# Patient Record
Sex: Female | Born: 1960 | Race: Black or African American | Hispanic: No | Smoking: Never smoker
Health system: Southern US, Community
[De-identification: ages and names within clinical notes are randomized; demographics above are authoritative.]

## PROBLEM LIST (undated history)

## (undated) DIAGNOSIS — T7840XA Allergy, unspecified, initial encounter: Secondary | ICD-10-CM

## (undated) HISTORY — DX: Allergy, unspecified, initial encounter: T78.40XA

---

## 1998-02-05 ENCOUNTER — Emergency Department (HOSPITAL_COMMUNITY): Admission: EM | Admit: 1998-02-05 | Discharge: 1998-02-05 | Payer: Self-pay | Admitting: Emergency Medicine

## 1999-10-05 ENCOUNTER — Emergency Department (HOSPITAL_COMMUNITY): Admission: EM | Admit: 1999-10-05 | Discharge: 1999-10-05 | Payer: Self-pay | Admitting: Emergency Medicine

## 1999-10-06 ENCOUNTER — Encounter: Payer: Self-pay | Admitting: Emergency Medicine

## 2000-11-08 ENCOUNTER — Encounter: Payer: Self-pay | Admitting: Obstetrics and Gynecology

## 2000-11-08 ENCOUNTER — Ambulatory Visit (HOSPITAL_COMMUNITY): Admission: RE | Admit: 2000-11-08 | Discharge: 2000-11-08 | Payer: Self-pay | Admitting: Obstetrics and Gynecology

## 2001-09-11 ENCOUNTER — Ambulatory Visit (HOSPITAL_COMMUNITY): Admission: RE | Admit: 2001-09-11 | Discharge: 2001-09-11 | Payer: Self-pay | Admitting: Internal Medicine

## 2006-07-03 HISTORY — PX: LUNG SURGERY: SHX703

## 2006-10-13 ENCOUNTER — Inpatient Hospital Stay (HOSPITAL_COMMUNITY): Admission: EM | Admit: 2006-10-13 | Discharge: 2006-10-23 | Payer: Self-pay | Admitting: Emergency Medicine

## 2006-10-14 ENCOUNTER — Ambulatory Visit: Payer: Self-pay | Admitting: Thoracic Surgery

## 2006-10-18 ENCOUNTER — Encounter (INDEPENDENT_AMBULATORY_CARE_PROVIDER_SITE_OTHER): Payer: Self-pay | Admitting: Specialist

## 2006-10-31 ENCOUNTER — Encounter: Admission: RE | Admit: 2006-10-31 | Discharge: 2006-10-31 | Payer: Self-pay | Admitting: Thoracic Surgery

## 2006-10-31 ENCOUNTER — Ambulatory Visit: Payer: Self-pay | Admitting: Thoracic Surgery

## 2006-11-28 ENCOUNTER — Encounter: Admission: RE | Admit: 2006-11-28 | Discharge: 2006-11-28 | Payer: Self-pay | Admitting: Thoracic Surgery

## 2006-11-28 ENCOUNTER — Ambulatory Visit: Payer: Self-pay | Admitting: Thoracic Surgery

## 2007-02-06 ENCOUNTER — Encounter: Admission: RE | Admit: 2007-02-06 | Discharge: 2007-02-06 | Payer: Self-pay | Admitting: Thoracic Surgery

## 2007-02-06 ENCOUNTER — Ambulatory Visit: Payer: Self-pay | Admitting: Thoracic Surgery

## 2008-04-28 IMAGING — CT CT CHEST W/ CM
3 series · 17 of 29 positions shown, 19 images · IV contrast (75ML OMNI 300)
Comparison: We have a report from an abdomen CT dated 10/06/99.

CLINICAL DATA: Spontaneous pneumothorax on the left. 
 CHEST CT WITH CONTRAST:
TECHNIQUE: Multidetector CT imaging of the chest was performed following the standard protocol during bolus administration of intravenous contrast.
 Contrast:  80 cc Omnipaque 300.

[Series 2: routine chest · axial · 0.77mm/px · z∈[-240,-60]mm · 6 of 56 slices shown, 8 images]
[im 10/56  mediastinal]
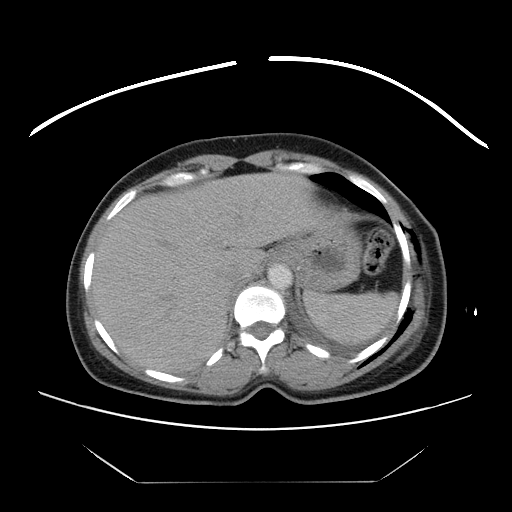
[im 10/56  lung]
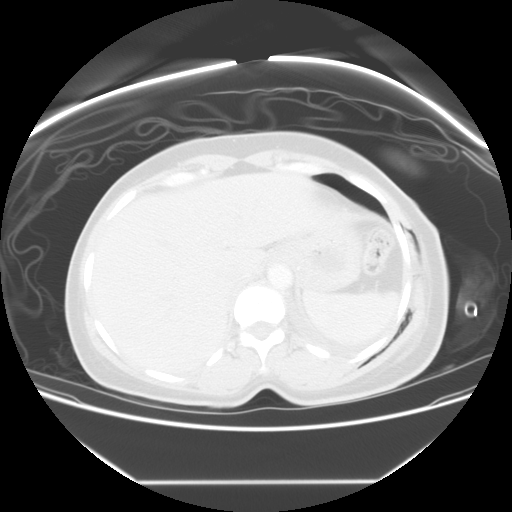
[im 19/56  lung]
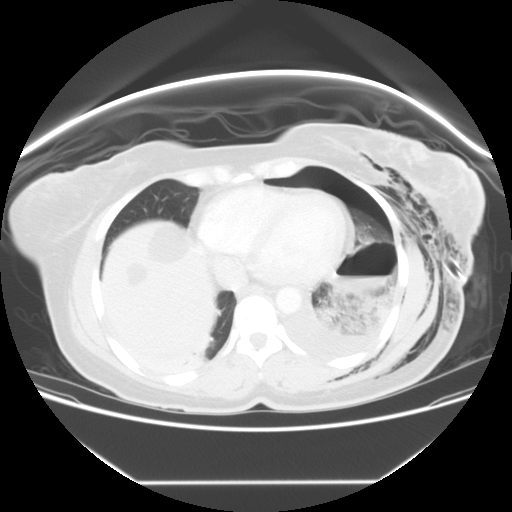
[im 28/56  lung]
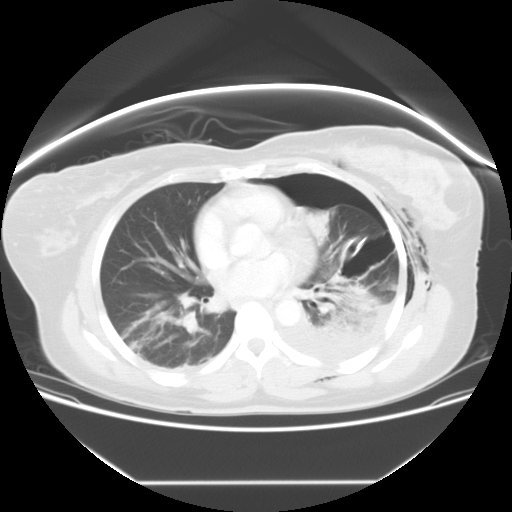
[im 32/56  lung]
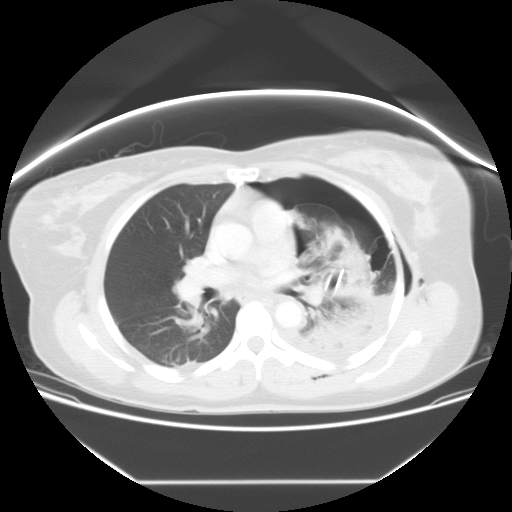
[im 37/56  mediastinal]
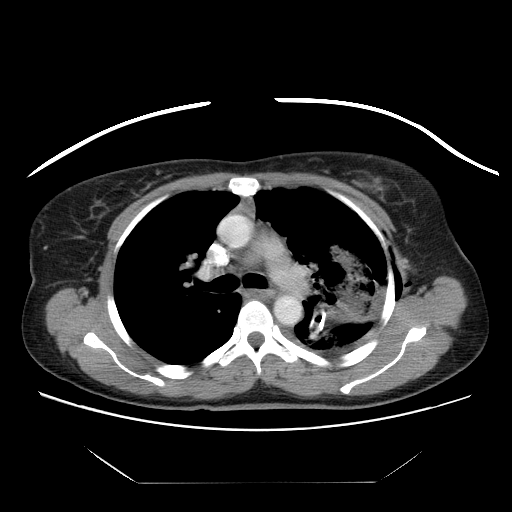
[im 37/56  lung]
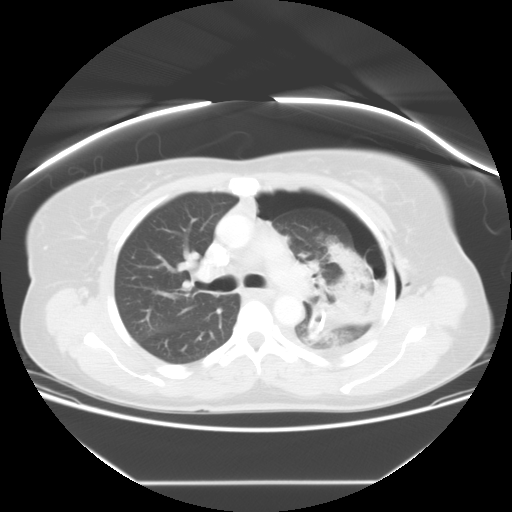
[im 46/56  lung]
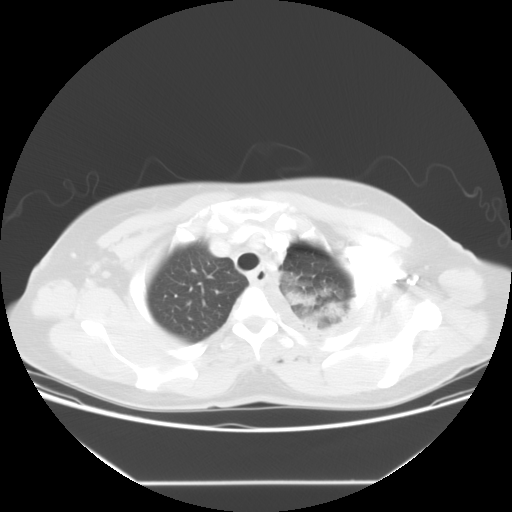

[Series 400: sag chest · sagittal · 0.62mm/px · 8 of 106 slices shown]
[im 9/106  lung]
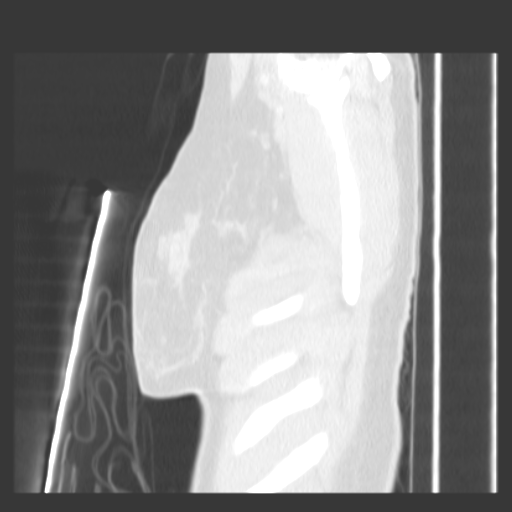
[im 27/106  lung]
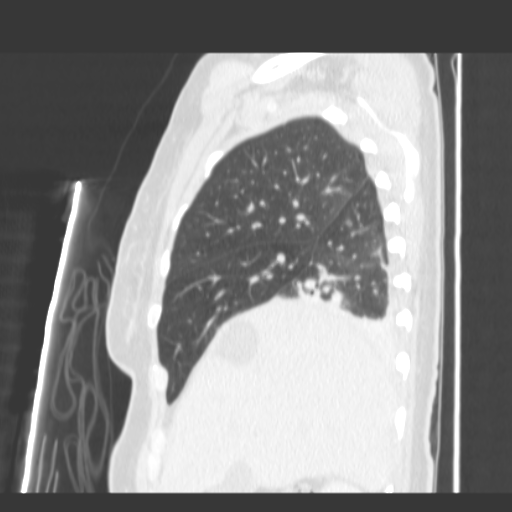
[im 36/106  lung]
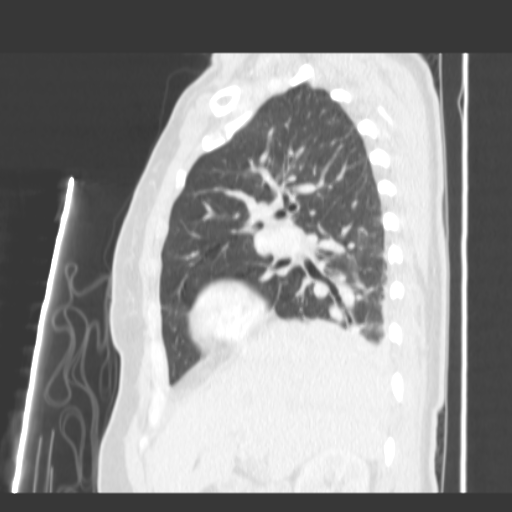
[im 44/106  lung]
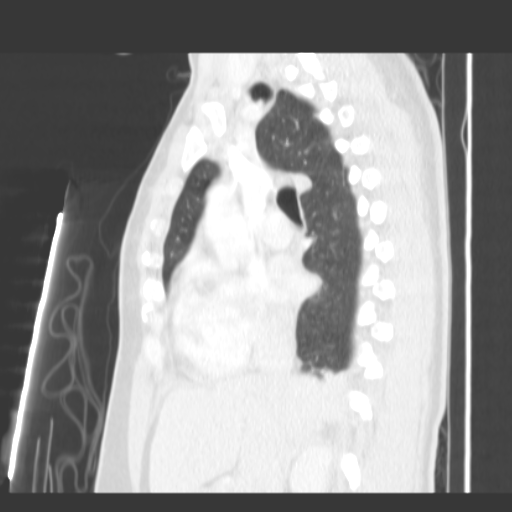
[im 62/106  lung]
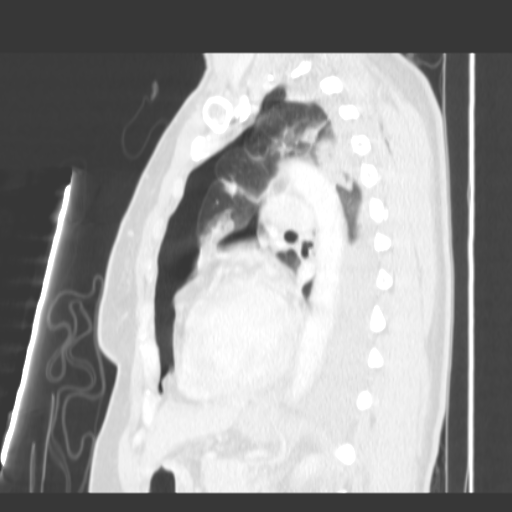
[im 71/106  lung]
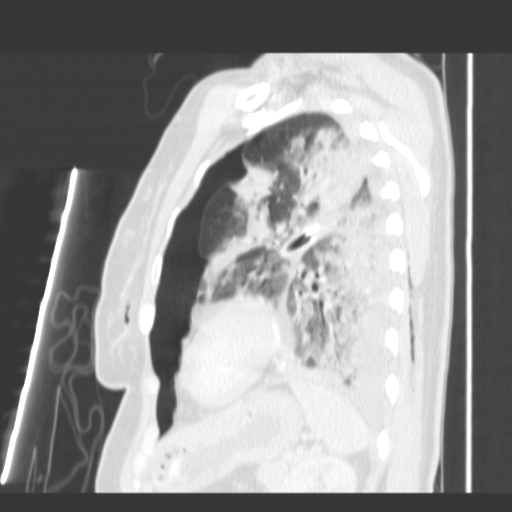
[im 79/106  lung]
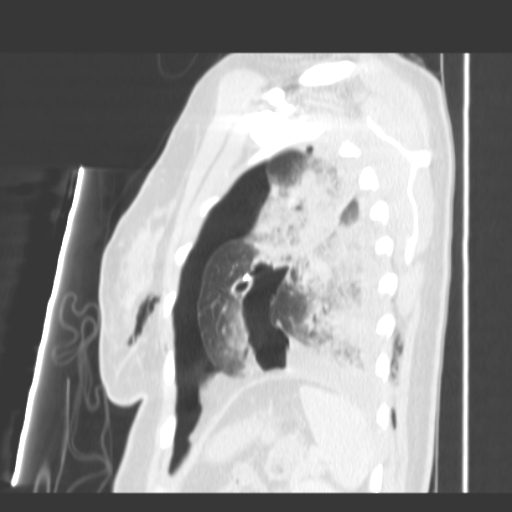
[im 97/106  lung]
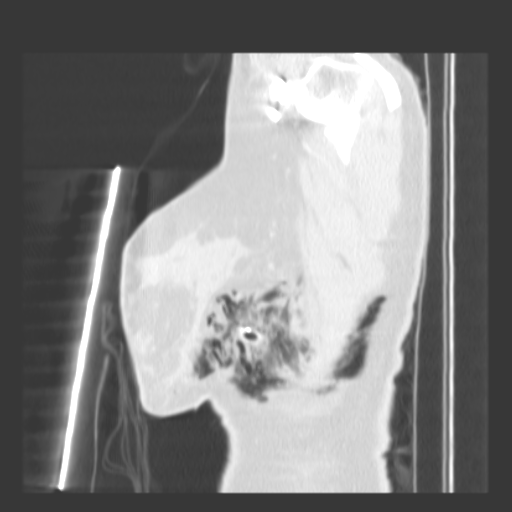

[Series 401: cor chest · coronal · 0.70mm/px · 3 of 68 slices shown]
[im 10/68  lung]
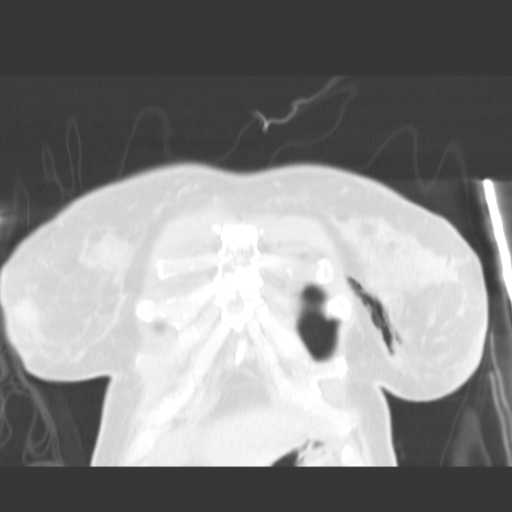
[im 20/68  lung]
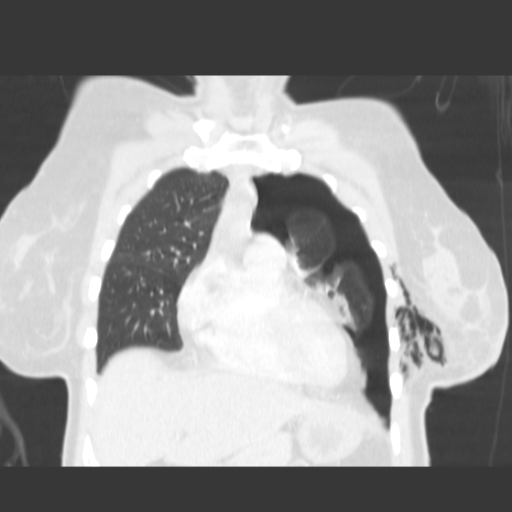
[im 29/68  lung]
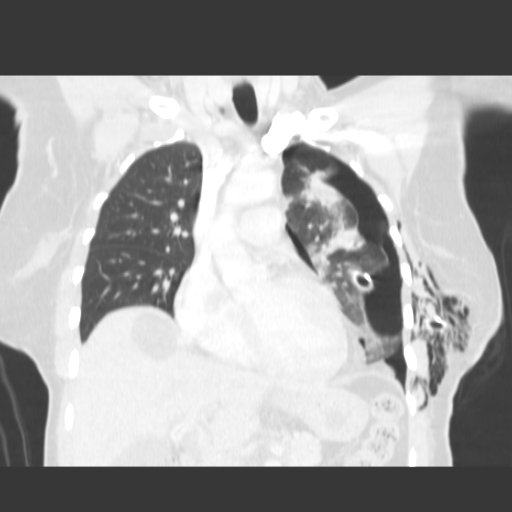

[17 of 29 positions shown; findings below may reference images not displayed]

Films are not available. The report describes a 7 cm bulla at the left lung base and liver cysts.
FINDINGS: A 15% left pneumothorax is present. A left chest tube passes into the left hemithorax and is positioned in the left greater fissure. It does pass adjacent what I believe is a large bleb in the left lower lobe containing an air-fluid level. Airspace disease is present in the left upper and left lower lobes diffusely.  Linear atelectasis is present at the right lung base. Negative abnormal mediastinal adenopathy. Negative pericardial effusion.  No pneumomediastinum. No right pneumothorax.  Extensive subcutaneous emphysema is seen over the left hemithorax. No acute fractures. 
 In the upper abdomen, incidental note is made of innumerable liver hypodensities.  There largest ones are characterized as cysts.
IMPRESSION: 1.  15% left pneumothorax. 
 2.  Left chest tube is in the greater fissure adjacent to a large left lower lobe bulla containing an air-fluid level.
 3.  Liver cysts.

## 2008-04-28 IMAGING — CR DG CHEST 1V PORT
1 series · 1 of 1 positions shown · non-contrast
Comparison: 10/15/06.

CLINICAL DATA: Pneumothorax. 
 PORTABLE CHEST ([DATE] HOURS):

[view not recorded]
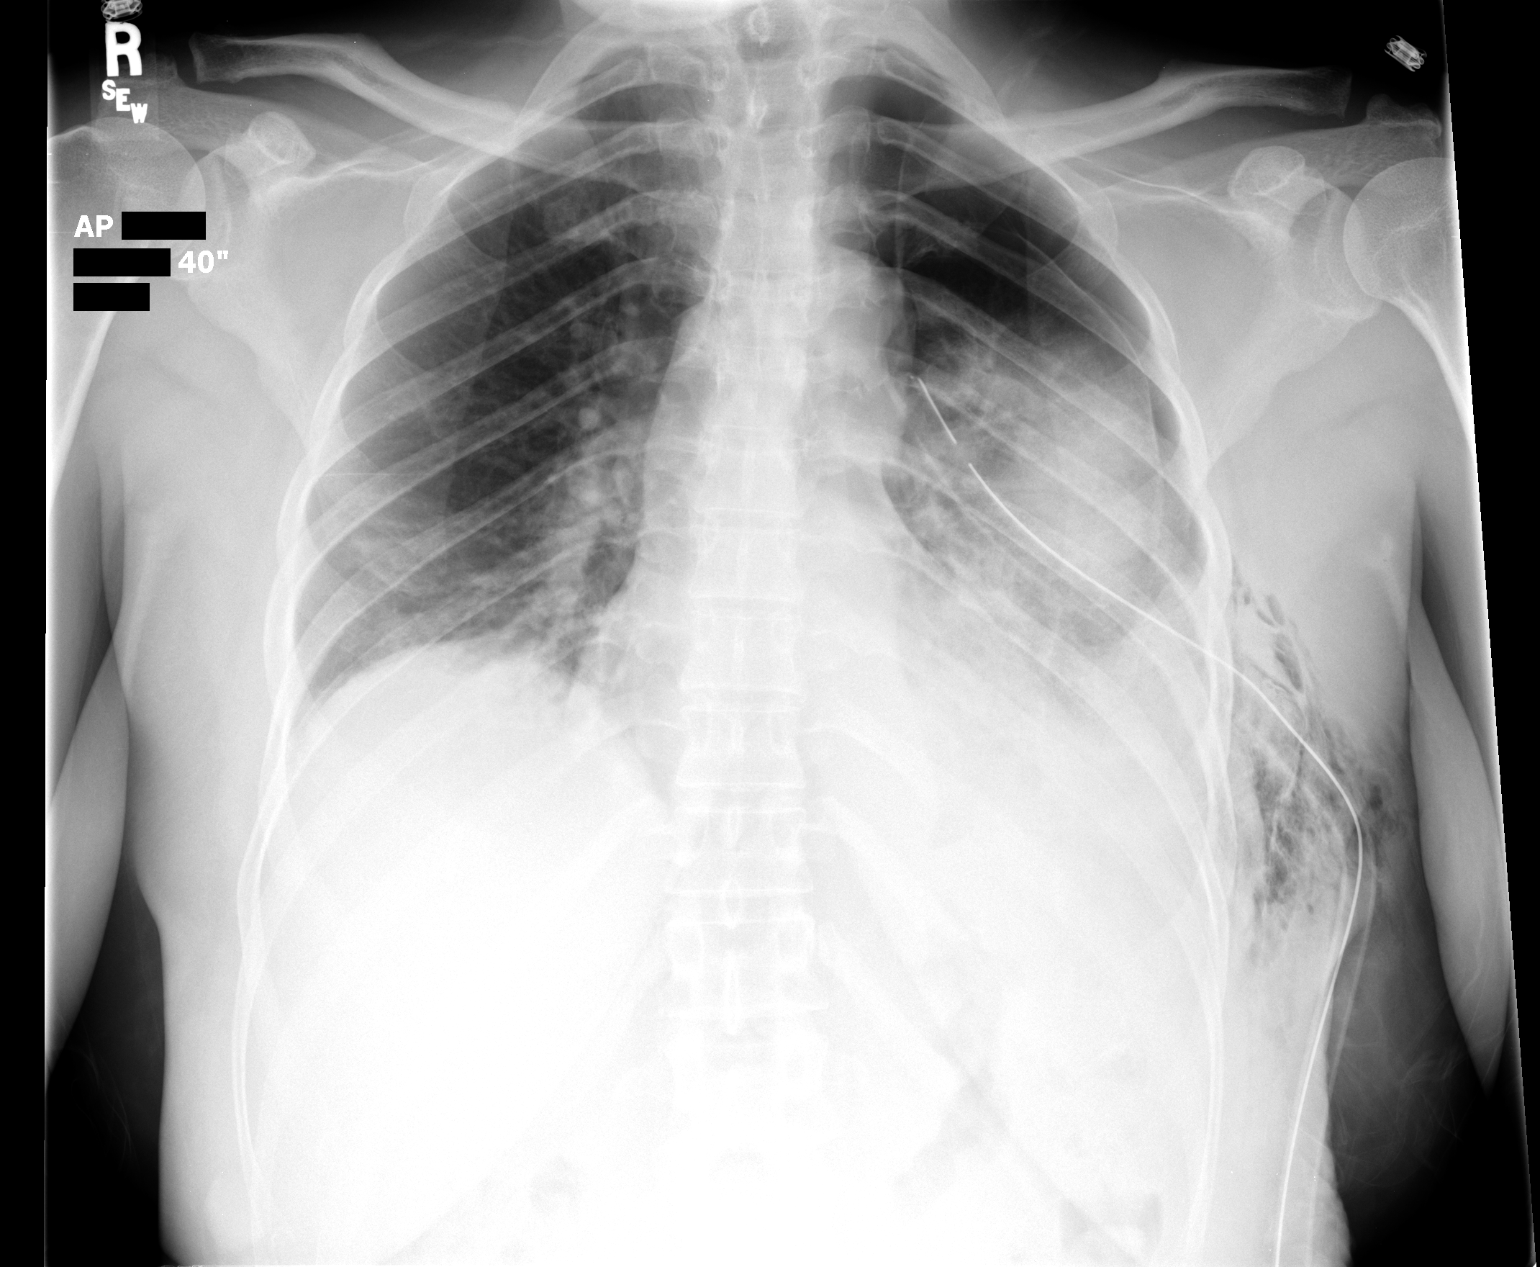

[1 of 1 positions shown; findings below may reference images not displayed]

FINDINGS: The left pneumothorax has increased and is now approximately 15%.  The chest tube remains in place.  Subcutaneous emphysema has increased.  Consolidation throughout the left lung is unchanged.  Bibasilar atelectasis is stable.  The heart remains mildly prominent.
IMPRESSION: Left pneumothorax has increased to 15%.

## 2010-11-15 NOTE — Assessment & Plan Note (Signed)
OFFICE VISIT   [name], [name]  DOB:  08/08/1960                                        February 06, 2007  CHART #:  [id number]   She came for followup today.  Her blood pressure is 142/92.  Pulse 100.  Respirations 18.  Sats were 98%.   Her chest x-ray showed no evidence for recurrence.   She is now four months since her trauma and is back to full activity.   We will see her back again if she has any future problems.   Ines Bloomer, M.D.  Electronically Signed   DPB/MEDQ  D:  02/06/2007  T:  02/06/2007  Job:  [id number]

## 2010-11-15 NOTE — Assessment & Plan Note (Signed)
OFFICE VISIT   [name], ROAT  DOB:  1961/05/16                                        Nov 28, 2006  CHART #:  [id number]   Ms. [name] came today.  She is stable after a left Vats for persistent  pneumothorax.  Her incisions are well-healed.  She has had minimal pain.  I released her to full activity, to return to work.  Her blood pressure  was 138/82, pulse 80, respirations 18, saturations were 100%.   Ines Bloomer, M.D.  Electronically Signed   DPB/MEDQ  D:  11/28/2006  T:  11/28/2006  Job:  [id number]

## 2010-11-18 NOTE — H&P (Signed)
[name], [name]              ACCOUNT NO.:  [id number]   MEDICAL RECORD NO.:  [id number]          PATIENT TYPE:  EMS   LOCATION:  MAJO                         FACILITY:  [facility]   PHYSICIAN:  Ines Bloomer, M.D. DATE OF BIRTH:  [birth year]   DATE OF ADMISSION:  10/13/2006  DATE OF DISCHARGE:                              HISTORY & PHYSICAL   HISTORY OF PRESENT ILLNESS:  This 46-year African American female had  some shortness of breath that she contributed to allergies.  Because of  increasing shortness, she went to the [facility] and a chest x-  ray revealed a left pneumothorax with a total collapse of the left lung  with some mild tension.  She is brought to the emergency room and  underwent insertion of the left chest tube without difficulty.  She is a  nonsmoker, has had seasonal allergies in the past.  No hemoptysis,  fever, chills, excessive sputum.  No evidence of pneumonia.   PAST MEDICAL HISTORY:  She has delivered two children.  Otherwise no  other hospitalization.   She is on no meds.    No allergies.   FAMILY HISTORY:  Noncontributory.   SOCIAL HISTORY:  She is a nonsmoker, married with two children, works in  a [employer].   REVIEW OF SYSTEMS:  GENERAL:  Her weight has been stable.  She has been  in good health.  CARDIAC:  No angina or atrial fibrillation.  PULMONARY:  See history of present illness.  GI:  No nausea, vomiting, constipation,  or diarrhea.  GU:  No dysuria or frequent urination.  VASCULAR:  No  claudication, DVT, TIAs.  NEUROLOGIC: No headaches, blackouts, or  seizures.  MUSCULOSKELETAL:  No joint pains, some mild arthritis.  HEMATOLOGIC:  No problems with bleeding or clotting disorders.   PHYSICAL EXAMINATION:  VITAL SIGNS:  Her blood pressure is 120/80, pulse  100, respirations 18, sats were 90%.  HEENT:  Head is atraumatic.  Eyes:  Pupils equal, reactive to light and  accommodation.  Extraocular movements normal.  Ears: Tympanic  membranes  are intact.  Nose: There is no septal deviation.  Throat without  lesions.  NECK:  Supple without thyromegaly.  There is no supraclavicular axillary  adenopathy.  CHEST:  Absent breath sounds on the left.  Normal breath sounds on the  right.  HEART:  Regular sinus rhythm, no murmurs.  ABDOMEN:  Soft.  There is no hepatosplenomegaly.  EXTREMITIES:  Pulse 2+.  There is no clubbing or edema.  NEUROLOGICAL:  She is oriented x3.  Sensory and motor intact.  Cranial  nerves intact.  SKIN:  Without lesion.   IMPRESSION:  Left spontaneous pneumothorax with tension.   PLAN:  Insertion of left chest tube.      Ines Bloomer, M.D.  Electronically Signed     DPB/MEDQ  D:  10/13/2006  T:  10/13/2006  Job:  [id number]

## 2010-11-18 NOTE — Discharge Summary (Signed)
[name], [name]              ACCOUNT NO.:  [id number]   MEDICAL RECORD NO.:  [id number]          PATIENT TYPE:  INP   LOCATION:  3301                         FACILITY:  [facility]   PHYSICIAN:  Ines Bloomer, M.D. DATE OF BIRTH:  23-Feb-1961   DATE OF ADMISSION:  10/13/2006  DATE OF DISCHARGE:  10/23/2006                               DISCHARGE SUMMARY   HISTORY OF PRESENT ILLNESS:  The patient is a 50 year old black female  who was recently evaluated at [facility] due to increasing  symptoms with shortness of breath that she attributed to allergies.  An  x-ray was obtained during this evaluation and this revealed a total  collapse of her left lung with some evidence of tension pneumothorax.  She was subsequently brought to the emergency room at [facility], where  she underwent insertion of a left chest tube without difficulty.  She is  a nonsmoker.  She has a history of seasonal allergies in the past.  She  denies hemoptysis, fevers, chills or excessive sputum.  There was no  evidence on chest x-ray of pneumonia.  She was felt to require admission  for further evaluation and treatment and was admitted to the thoracic  surgical service under the care of Dr. Edwyna Shell.   PAST MEDICAL HISTORY:  Is unremarkable.  She has delivered 2 children.   MEDICATIONS PRIOR TO ADMISSION:  None.   ALLERGIES TO MEDICATIONS:  NONE.   FAMILY HISTORY:  Noncontributory.   SOCIAL HISTORY:  She is a nonsmoker.  She works in a [employer].  She is  married and has 2 children.   REVIEW OF SYSTEMS:  Please see the history and physical done at the time  of admission.   PHYSICAL EXAMINATION:  Please see the history and physical done at the  time of admission.   HOSPITAL COURSE:  Patient was admitted.  Dr. Edwyna Shell evaluated the  patient and the patient was kept on chest tube suction for a couple of  days.  Daily chest x-rays were obtained to monitor her progress.  She  unfortunately continued to  have some evidence of pneumothorax and  subsequent to this a chest CT scan was obtained.  This revealed a 30%  pneumothorax with large bolus disease.  She did not progress in the  usual manner and, therefore, it was Dr. Scheryl Darter opinion that as the  chest tube alone was not functioning, she would require a video-assisted  thoracoscopy for resection of a large left upper lobe bola, as well as  mechanical pleurodesis for a more definitive treatment of her condition.   PROCEDURE:  On October 19, 2006, she was taken to the operating room, at  which time she underwent the following procedure:  Left video-assisted  thoracoscopy with resection of large left upper lobe bola and mechanical  pleurodesis.  She tolerated this procedure well and was taken to the  postanesthesia care unit in stable condition.   POSTOPERATIVE HOSPITAL COURSE:  Patient has remained stable.  She did  not have an air leak in her chest tube system.  Her chest tubes were  discontinued in a routine manner.  Her incision is healing without  evidence of infection.  She is tolerating a routine advancement in  activity, commenced for the level of postoperative convalescence using  standard protocols.  Her overall status is felt to be tentatively stable  for discharge in the morning of October 23, 2006 pending morning round  reevaluation.   CONDITION ON DISCHARGE:  Stable and improving.   FINAL DIAGNOSIS:  Large left-sided long boli, now status post resection.  Admission diagnosis of spontaneous pneumothorax (tension).   OTHER DIAGNOSES:  1. Previous history of childbirth x2.  2. History of environmental allergies, seasonal.   INSTRUCTIONS:  The patient received written instructions in regard to  medications, activity, diet, wound care and followup.   FOLLOWUP:  Include Dr. Edwyna Shell in 1 week, an office appointment will be  arranged for the patient with a chest x-ray.   MEDICATIONS ON DISCHARGE:  For pain, Tylox 1 or 2 q.6  hours as needed.      Rowe Clack, P.A.-C.      Ines Bloomer, M.D.  Electronically Signed    WEG/MEDQ  D:  10/22/2006  T:  10/22/2006  Job:  [id number]

## 2010-11-18 NOTE — Op Note (Signed)
NAMEKEYUNNA, COCO              ACCOUNT NO.:  [id number]   MEDICAL RECORD NO.:  [id number]          PATIENT TYPE:  INP   LOCATION:  5705                         FACILITY:  [facility]   PHYSICIAN:  Ines Bloomer, M.D. DATE OF BIRTH:  [birth year]   DATE OF PROCEDURE:  10/19/2006  DATE OF DISCHARGE:                               OPERATIVE REPORT   PREOPERATIVE DIAGNOSIS:  Persistent left pneumothorax and left apical  bulla.   POSTOPERATIVE DIAGNOSIS:  Giant bulla left apex.   OPERATION PERFORMED:  Left video assisted thoracoscopic surgery,  resection of giant apical bulla, pleurectomy, and pleurodesis.   SURGEON:  Ines Bloomer, M.D.   FIRST ASSISTANT:  Rowe Clack, P.A.-C.   After percutaneous insertion of all monitoring lines, the patient  underwent general anesthesia, and was prepped and draped in the usual  sterile manner.  A double lumen tube was inserted, the left lung was  deflated. The previous chest tube was removed.  It was used as one  trocar site, it was in the anterior axillary line at the sixth  intercostal space and then at the posterior axillary line at the seventh  intercostal space, three trocars were inserted and you could seed a  giant bulla. Pictures were taken of this.  A 3 cm incision was made in  the third intercostal space at the anterior axillary line and the third  intercostal space was entered.  Through that was inserted a double  trocar and the bulla had marked adhesions to the chest wall.  So, the  first thing we did was take down the adhesions with electrocautery and  using the autosuture 30 blue stapler to remove it.  After this had been  done, the bulla was reflected superiorly down to the small base and then  coming across with an autosuture 60 stapler, this was resected and the  bulla was resected. After the bulla was dissected, we then did a  pleurectomy using Kaiser ring forceps down to the fifth rib and then  using a Bovie scraper, did a  mechanical pleurodesis removed.  Three  chest tubes were placed through the trocar sites and tied in place with  0 zero silk.  The superior trocar site was closed with 2-0 Vicryl and 3-  0 Vicryl as a subcuticular stitch.  The patient was returned to the  recovery room in stable condition.      Ines Bloomer, M.D.  Electronically Signed     DPB/MEDQ  D:  10/19/2006  T:  10/20/2006  Job:  [id number]

## 2010-11-18 NOTE — Op Note (Signed)
[facility]  Patient:    [name], [name] Visit Number: [id number] MRN: [id number]          Service Type: DSU Location: [facility] Attending Physician:  Malon Kindle Dictated by:   Malachi Pro. Ambrose Mantle, M.D. Proc. Date: 09/11/01 Admit Date:  09/11/2001                             Operative Report  PREOPERATIVE DIAGNOSES:       Leiomyomata uteri, voluntary sterilization.  POSTOPERATIVE DIAGNOSES:      Leiomyomata uteri, voluntary sterilization.  OPERATION:                    Laparoscopic tubal cauterization.  OPERATOR:                     Malachi Pro. Ambrose Mantle, M.D.  ANESTHESIA:                   General.  PROCEDURE:                    The patient was brought to the operating room and placed under satisfactory general anesthesia.  She was placed in Chester stirrups.  The abdomen, vulva, vagina, and urethra were prepped with Betadine solution.  The bladder was emptied with a [place] catheter.  Examination revealed the uterus to be anterior, approximately 10 weeks size, irregular. No adnexal masses were felt.  A Hulka cannula was placed into the uterus and attached to the anterior cervical lip.  The abdomen was then draped as a sterile field.  A small incision was made in the inferior portion of the umbilicus.  A Verres cannula was placed into the abdominal cavity.  Aspiration revealed it was not in a blood vessel, not in bowel contents or urine.  Saline was pushed into the peritoneal cavity and none could be recovered.  There seemed to be a negative pressure.  I then inflated about 2 L of CO2 into the peritoneal cavity with pressures of 7-12 mmHg.  Then a large incision inserted the laparoscopic trocar followed by the laparoscope with good visualization of the pelvic contents.  A smaller incision was made suprapubically and a smaller trocar was inserted under direct vision.  The uterus was manipulated into the operative field, although it was  somewhat difficult to manipulate it because it was so big and the cannula did not really move it as well as you might want it to.  I was able to inspect the posterior cul-de-sac.  There was a small amount of endometriosis lateral to the left uterosacral ligament.  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  The anterior cul-de-sac was normal.  The right tube and ovary were normal as were the left tube and ovary. Exploration of the upper abdomen revealed the liver to be smooth.  I did not see the gallbladder.  I did not see any upper abdominal abnormalities.  I then cauterized each tube in three or four consecutive locations starting in the mid portion and going proximally toward the uterine cornu.  I continued the cauterization until the meter read 0.  Reinspection of the surgical site revealed no bleeding.  I inspected the lower abdomen trocar site for any bleeding.  There was none.  I then inspected the umbilical trocar  site for any bleeding as I withdrew the laparoscope after releasing the pneumoperitoneum and there was no bleeding.  I closed the umbilical incision with 3-0 plain catgut interrupted sutures and the lower incision with Steri-Strips.  I removed the Hulka cannula and the procedure was terminated.  The patient was returned to recovery in satisfactory condition.  I did make pictures of the fibroids and both tubes and ovaries. Dictated by:   Malachi Pro. Ambrose Mantle, M.D. Attending Physician:  Malon Kindle DD:  09/11/01 TD:  09/12/01 Job: [id number]

## 2014-03-05 ENCOUNTER — Emergency Department (HOSPITAL_COMMUNITY)
Admission: EM | Admit: 2014-03-05 | Discharge: 2014-03-05 | Disposition: A | Discharge status: Home / Self Care | Payer: Worker's Compensation | Attending: Emergency Medicine | Admitting: Emergency Medicine

## 2014-03-05 ENCOUNTER — Encounter (HOSPITAL_COMMUNITY): Payer: Self-pay | Admitting: Emergency Medicine

## 2014-03-05 DIAGNOSIS — IMO0001 Reserved for inherently not codable concepts without codable children: Secondary | ICD-10-CM | POA: Diagnosis not present

## 2014-03-05 DIAGNOSIS — M25539 Pain in unspecified wrist: Secondary | ICD-10-CM | POA: Insufficient documentation

## 2014-03-05 DIAGNOSIS — M79609 Pain in unspecified limb: Secondary | ICD-10-CM | POA: Insufficient documentation

## 2014-03-05 DIAGNOSIS — M79601 Pain in right arm: Secondary | ICD-10-CM

## 2014-03-05 MED ORDER — MELOXICAM 7.5 MG PO TABS: 7.5000 mg | ORAL_TABLET | Freq: Every day | ORAL | Status: DC

## 2014-03-05 NOTE — ED Notes (Signed)
Pt c/o right wrist pain that radiates up to her elbow x 3-4 months. Works packing bread in boxes.

## 2014-03-05 NOTE — Discharge Instructions (Signed)
Please read and follow all provided instructions.  Your diagnoses today include:  1. Upper extremity pain, diffuse, right     Tests performed today include:  Vital signs. See below for your results today.   Medications prescribed:   Meloxicam - anti-inflammatory pain medication  You have been prescribed an anti-inflammatory medication or NSAID. Take with food. Do not take aspirin, ibuprofen, or naproxen if taking this medication. Take smallest effective dose for the shortest duration needed for your pain. Stop taking if you experience stomach pain or vomiting.    Take any prescribed medications only as directed.  Home care instructions:   Follow any educational materials contained in this packet  Follow R.I.C.E. Protocol:  R - rest your injury   I  - use ice on injury without applying directly to skin  C - compress injury with bandage or splint  E - elevate the injury as much as possible  Follow-up instructions: Please follow-up with your primary care provider or the provided orthopedic physician (bone specialist) if you continue to have significant pain in 1 week.   Return instructions:   Please return if your fingers are persistently numb or tingling, appear gray or blue, or you have severe pain (also elevate leg and loosen splint or wrap if you were given one)  Please return to the Emergency Department if you experience worsening symptoms.   Please return if you have any other emergent concerns.  Additional Information:  Your vital signs today were: BP 144/92   Pulse 79   Temp(Src) 98.7 F (37.1 C) (Oral)   Resp 18   SpO2 99% If your blood pressure (BP) was elevated above 135/85 this visit, please have this repeated by your doctor within one month. --------------

## 2014-03-05 NOTE — ED Provider Notes (Signed)
CSN: [id number]     Arrival date & time 03/05/14  1042 History  This chart was scribed for non-physician practitioner, Rhea Bleacher, PA-C working with Flint Melter, MD by Greggory Stallion, ED scribe. This patient was seen in room TR10C/TR10C and the patient's care was started at 11:20 AM.   Chief Complaint  Patient presents with  . Wrist Pain   The history is provided by the patient. No language interpreter was used.   HPI Comments: [name] is a 53 y.o. female who presents to the Emergency Department complaining of right wrist pain that radiates into her elbow that worsened about 5-6 months ago. States she has had similar pain for the last 2 years. Reports intermittent tingling in her fingers and elbow. Pt does repetitive movements of her wrists at work. She has used a wrist brace and taken aleve with little relief. Certain movements worsen the pain. Denies numbness. Denies history of carpal tunnel.   History reviewed. No pertinent past medical history. History reviewed. No pertinent past surgical history. History reviewed. No pertinent family history. History  Substance Use Topics  . Smoking status: Never Smoker   . Smokeless tobacco: Not on file  . Alcohol Use: No   OB History   Grav Para Term Preterm Abortions TAB SAB Ect Mult Living                 Review of Systems  Constitutional: Negative for fever.  HENT: Negative for congestion.   Eyes: Negative for redness.  Respiratory: Negative for shortness of breath.   Cardiovascular: Negative for chest pain.  Gastrointestinal: Negative for abdominal distention.  Musculoskeletal: Positive for arthralgias and myalgias. Negative for neck pain.  Skin: Negative for rash.  Neurological: Negative for numbness.  Psychiatric/Behavioral: Negative for confusion.   Allergies  Codeine  Home Medications   Prior to Admission medications   Not on File   BP 144/92  Pulse 79  Temp(Src) 98.7 F (37.1 C) (Oral)  Resp 18  SpO2  99%  Physical Exam  Nursing note and vitals reviewed. Constitutional: She is oriented to person, place, and time. She appears well-developed and well-nourished. No distress.  HENT:  Head: Normocephalic and atraumatic.  Eyes: Conjunctivae and EOM are normal. Pupils are equal, round, and reactive to light.  Neck: Normal range of motion. Neck supple. No tracheal deviation present.  Cardiovascular: Normal rate.  Exam reveals no decreased pulses.   Pulmonary/Chest: Effort normal. No respiratory distress.  Musculoskeletal: Normal range of motion. She exhibits tenderness. She exhibits no edema.       Right shoulder: Normal.       Right elbow: Normal.      Right wrist: She exhibits tenderness. She exhibits normal range of motion, no bony tenderness and no swelling.       Right upper arm: Normal.       Right forearm: She exhibits no tenderness, no bony tenderness and no swelling.       Right hand: Normal. She exhibits normal range of motion, no tenderness and no swelling. Normal sensation noted. Normal strength noted.  Pos tinel, Neg Phalen  Neurological: She is alert and oriented to person, place, and time. No sensory deficit.  Motor, sensation, and vascular distal to the injury is fully intact.   Skin: Skin is warm and dry.  Psychiatric: She has a normal mood and affect. Her behavior is normal.    ED Course  Procedures (including critical care time)  DIAGNOSTIC STUDIES: Oxygen Saturation is  99% on RA, normal by my interpretation.    COORDINATION OF CARE: 11:26 AM-Discussed treatment plan which includes a stronger anti-inflammatory with pt at bedside and pt agreed to plan. Will give pt an orthopedic referral and advised her to follow up.   Labs Review Labs Reviewed - No data to display  Imaging Review No results found.   EKG Interpretation None        Vital signs reviewed and are as follows: BP 144/92  Pulse 79  Temp(Src) 98.7 F (37.1 C) (Oral)  Resp 18  SpO2  99%  Ortho referral given. Patient to continue stretching exercises. Counseled on use of prescription NSAID.  MDM   Final diagnoses:  Upper extremity pain, diffuse, right   Patient with upper extremity pain likely due to overuse injury, possible carpal or cubital tunnel syndrome. She will nee to follow up with orthopedist for definitive diagnosis and treatment. At this time, will give prescription NSAID. Patient counseled on rice protocol. No acute neurovascular compromise suspected.  I personally performed the services described in this documentation, which was scribed in my presence. The recorded information has been reviewed and is accurate.d  Renne Crigler, PA-C 03/05/14 1146

## 2014-03-05 NOTE — ED Notes (Signed)
[name], phlebotomy, at bedside to get work comp information and urine sample from patient.

## 2014-03-05 NOTE — ED Notes (Signed)
Registration at bedside with patient going over work comp information.

## 2014-03-05 NOTE — ED Provider Notes (Signed)
Medical screening examination/treatment/procedure(s) were performed by non-physician practitioner and as supervising physician I was immediately available for consultation/collaboration.   EKG Interpretation None       Trevious Rampey L Anahit Klumb, MD 03/05/14 2010 

## 2014-03-05 NOTE — ED Notes (Signed)
Went to discharge patient and she advised that she has to have a drug test for workers comp.   Contacted Rulon Eisenmenger to advise need someone to come get from patient.  Also spoke with Horner.

## 2014-03-05 NOTE — ED Notes (Signed)
Pt c/o right wrist pain x 2 years worse over several months into elbow; pt sts her job involves using her hands

## 2016-02-05 ENCOUNTER — Ambulatory Visit (INDEPENDENT_AMBULATORY_CARE_PROVIDER_SITE_OTHER): Payer: BLUE CROSS/BLUE SHIELD | Admitting: Osteopathic Medicine

## 2016-02-05 VITALS — BP 124/82 | HR 98 | Temp 98.9°F | Resp 14 | Ht 65.0 in | Wt 193.0 lb

## 2016-02-05 DIAGNOSIS — M545 Low back pain, unspecified: Secondary | ICD-10-CM | POA: Insufficient documentation

## 2016-02-05 DIAGNOSIS — R350 Frequency of micturition: Secondary | ICD-10-CM | POA: Diagnosis not present

## 2016-02-05 LAB — POC MICROSCOPIC URINALYSIS (UMFC): MUCUS RE: ABSENT

## 2016-02-05 LAB — POCT URINALYSIS DIP (MANUAL ENTRY)
BILIRUBIN UA: NEGATIVE
BILIRUBIN UA: NEGATIVE
Glucose, UA: NEGATIVE
NITRITE UA: NEGATIVE
PH UA: 6
PROTEIN UA: NEGATIVE
Spec Grav, UA: 1.025
Urobilinogen, UA: 0.2

## 2016-02-05 MED ORDER — KETOROLAC TROMETHAMINE 60 MG/2ML IM SOLN
60.0000 mg | Freq: Once | INTRAMUSCULAR | Status: AC
  Administered 2016-02-05: 60 mg via INTRAMUSCULAR

## 2016-02-05 MED ORDER — MELOXICAM 7.5 MG PO TABS: 7.5000 mg | ORAL_TABLET | Freq: Every day | ORAL | 0 refills | Status: DC | PRN

## 2016-02-05 NOTE — Progress Notes (Signed)
HPI: [name] is a 55 y.o.  female  who presents to [facility] today, 02/05/16,  for chief complaint of:  Chief Complaint  Patient presents with  . Urinary Tract Infection    C/O urinary frequency, pressure in lower back, & pains radiating down legs x 1 mth     . Context: previous UTI >1 year ago, similar symptoms that time - No urine culture available for review . Quality: Low back pain, feels like lower abdominal pressure, no hematuria, no dysuria. (+) frequency - needing to go about every 1-2 hours or so while awake. Feels some urgency. No incontinence . Duration: 1 month . Assoc signs/symptoms: lower back pain bilaterally. Hx fibroids.    Past medical, surgical, social and family history reviewed: Past Medical History:  Diagnosis Date  . Allergy    No past surgical history on file. Social History  Substance Use Topics  . Smoking status: Never Smoker  . Smokeless tobacco: Not on file  . Alcohol use No   No family history on file.   Current medication list and allergy/intolerance information reviewed:   Current Outpatient Prescriptions  Medication Sig Dispense Refill  . lisinopril (PRINIVIL,ZESTRIL) 10 MG tablet Take 10 mg by mouth.     No current facility-administered medications for this visit.    Allergies  Allergen Reactions  . Codeine Nausea And Vomiting      Review of Systems:  Constitutional:  No  fever, no chills  Cardiac: No  chest pain, No  pressure, No palpitations,  Respiratory:  No  shortness of breath. No  Cough  Gastrointestinal: No  abdominal pain, No  nausea, No  vomiting,  Musculoskeletal: No new myalgia/arthralgiaher than back pain as noted per history of present illness  Genitourinary: No  incontinence, No  abnormal genital bleeding, No abnormal genital discharge, urinary frequency as noted in history of present illness   Skin: No  Rash,   Neurologic: No  weakness, No  Dizziness, no saddle  anesthesia/incontinence   Exam:  BP 124/82 (BP Location: Right Arm, Patient Position: Sitting, Cuff Size: Normal)   Pulse 98   Temp 98.9 F (37.2 C) (Oral)   Resp 14   Ht 5\' 5"  (1.651 m)   Wt 193 lb (87.5 kg)   SpO2 100%   BMI 32.12 kg/m   Constitutional: VS see above. General Appearance: alert, well-developed, well-nourished, NAD  Respiratory: Normal respiratory effort.  Cardiovascular:  No lower extremity edema.   Musculoskeletal: Gait normal. No clubbing/cyanosis of digits. Straight leg raise on left produces low back pain but no sciatica symptoms. Positive lateral lumbar tenderness worse on left, normal flexion/extension/rotation L spine. Pronounced lumbar lordosis.  Skin: warm, dry, intact. No rash/ulcer. No concerning nevi or subq nodules on limited exam.    Psychiatric: Normal judgment/insight. Normal mood and affect. Oriented x3.   Pelvic exam declined   Results for orders placed or performed in visit on 02/05/16 (from the past 72 hour(s))  POCT Microscopic Urinalysis ([facility])     Status: Abnormal   Collection Time: 02/05/16  1:58 PM  Result Value Ref Range   WBC,UR,HPF,POC Few (A) None WBC/hpf   RBC,UR,HPF,POC Few (A) None RBC/hpf   Bacteria Few (A) None, Too numerous to count   Mucus Absent Absent   Epithelial Cells, UR Per Microscopy Moderate (A) None, Too numerous to count cells/hpf  POCT urinalysis dipstick     Status: Abnormal   Collection Time: 02/05/16  1:58 PM  Result Value  Ref Range   Color, UA yellow yellow   Clarity, UA clear clear   Glucose, UA negative negative   Bilirubin, UA negative negative   Ketones, POC UA negative negative   Spec Grav, UA 1.025    Blood, UA small (A) negative   pH, UA 6.0    Protein Ur, POC negative negative   Urobilinogen, UA 0.2    Nitrite, UA Negative Negative   Leukocytes, UA Trace (A) Negative    No results found.   ASSESSMENT/PLAN:   Urinalysis not consistent with urinary tract infection (Few white blood  cells, significant epithelial cell contamination), no dysuria, will send for culture to confirm. Patient states she is up-to-date on annual physicals every year, diabetes screening is up-to-date, urine shows no glucose or ketone  patient's symptoms are more concerning for musculoskeletal pathology such as somato-visceral reflex due to low back pain/strain, also patient has history of fibroid which could be contributing, patient declines pelvic exam today and will follow-up with OB/GYN for this issue. Significant lumbar lordosis.   The patient was printed instructions for home  rehabilitation exercises.  Hold off on x-ray given no acute injury, pain is only lasted one week. Consider x-ray if pain persists or worsens/changes. Consider physical therapy if no improvement with home exercises, home medications.  Left-sided low back pain without sciatica - Plan: ketorolac (TORADOL) injection 60 mg, meloxicam (MOBIC) 7.5 MG tablet  Urinary frequency - Plan: POCT Microscopic Urinalysis ([facility]), POCT urinalysis dipstick, Urine culture     Visit summary with medication list and pertinent instructions was printed for patient to review. All questions at time of visit were answered - patient instructed to contact office with any additional concerns. ER/RTC precautions were reviewed with the patient. Follow-up plan: Return if symptoms worsen or fail to improve, and as directed by PCP and OBGYN - routine care / recheck.

## 2016-02-05 NOTE — Patient Instructions (Addendum)
   IF you received an x-ray today, you will receive an invoice from [facility]. Please contact [facility] at [phone] with questions or concerns regarding your invoice.   IF you received labwork today, you will receive an invoice from [facility]. Please contact [facility] at [phone] with questions or concerns regarding your invoice.   Our billing staff will not be able to assist you with questions regarding bills from these companies.  You will be contacted with the lab results as soon as they are available. The fastest way to get your results is to activate your My Chart account. Instructions are located on the last page of this paperwork. If you have not heard from us regarding the results in 2 weeks, please contact this office.     Low Back Sprain With Rehab A sprain is an injury in which a ligament is torn. The ligaments of the lower back are vulnerable to sprains. However, they are strong and require great force to be injured. These ligaments are important for stabilizing the spinal column. Sprains are classified into three categories. Grade 1 sprains cause pain, but the tendon is not lengthened. Grade 2 sprains include a lengthened ligament, due to the ligament being stretched or partially ruptured. With grade 2 sprains there is still function, although the function may be decreased. Grade 3 sprains involve a complete tear of the tendon or muscle, and function is usually impaired. SYMPTOMS   Severe pain in the lower back.  Sometimes, a feeling of a "pop," "snap," or tear, at the time of injury.  Tenderness and sometimes swelling at the injury site.  Uncommonly, bruising (contusion) within 48 hours of injury.  Muscle spasms in the back. CAUSES  Low back sprains occur when a force is placed on the ligaments that is greater than they can handle. Common causes of injury include:  Performing a stressful act while  off-balance.  Repetitive stressful activities that involve movement of the lower back.  Direct hit (trauma) to the lower back. RISK INCREASES WITH:  Contact sports (football, wrestling).  Collisions (major skiing accidents).  Sports that require throwing or lifting (baseball, weightlifting).  Sports involving twisting of the spine (gymnastics, diving, tennis, golf).  Poor strength and flexibility.  Inadequate protection.  Previous back injury or surgery (especially fusion). PREVENTION  Wear properly fitted and padded protective equipment.  Warm up and stretch properly before activity.  Allow for adequate recovery between workouts.  Maintain physical fitness:  Strength, flexibility, and endurance.  Cardiovascular fitness.  Maintain a healthy body weight. PROGNOSIS  If treated properly, low back sprains usually heal with non-surgical treatment. The length of time for healing depends on the severity of the injury.  RELATED COMPLICATIONS   Recurring symptoms, resulting in a chronic problem.  Chronic inflammation and pain in the low back.  Delayed healing or resolution of symptoms, especially if activity is resumed too soon.  Prolonged impairment.  Unstable or arthritic joints of the low back. TREATMENT  Treatment first involves the use of ice and medicine, to reduce pain and inflammation. The use of strengthening and stretching exercises may help reduce pain with activity. These exercises may be performed at home or with a therapist. Severe injuries may require referral to a therapist for further evaluation and treatment, such as ultrasound. Your caregiver may advise that you wear a back brace or corset, to help reduce pain and discomfort. Often, prolonged bed rest results in greater harm then benefit. Corticosteroid injections may   be recommended. However, these should be reserved for the most serious cases. It is important to avoid using your back when lifting objects.  At night, sleep on your back on a firm mattress, with a pillow placed under your knees. If non-surgical treatment is unsuccessful, surgery may be needed.  MEDICATION   If pain medicine is needed, nonsteroidal anti-inflammatory medicines (aspirin and ibuprofen), or other minor pain relievers (acetaminophen), are often advised.  Do not take pain medicine for 7 days before surgery.  Prescription pain relievers may be given, if your caregiver thinks they are needed. Use only as directed and only as much as you need.  Ointments applied to the skin may be helpful.  Corticosteroid injections may be given by your caregiver. These injections should be reserved for the most serious cases, because they may only be given a certain number of times. HEAT AND COLD  Cold treatment (icing) should be applied for 10 to 15 minutes every 2 to 3 hours for inflammation and pain, and immediately after activity that aggravates your symptoms. Use ice packs or an ice massage.  Heat treatment may be used before performing stretching and strengthening activities prescribed by your caregiver, physical therapist, or athletic trainer. Use a heat pack or a warm water soak. SEEK MEDICAL CARE IF:   Symptoms get worse or do not improve in 2 to 4 weeks, despite treatment.  You develop numbness or weakness in either leg.  You lose bowel or bladder function.  Any of the following occur after surgery: fever, increased pain, swelling, redness, drainage of fluids, or bleeding in the affected area.  New, unexplained symptoms develop. (Drugs used in treatment may produce side effects.) EXERCISES  RANGE OF MOTION (ROM) AND STRETCHING EXERCISES - Low Back Sprain Most people with lower back pain will find that their symptoms get worse with excessive bending forward (flexion) or arching at the lower back (extension). The exercises that will help resolve your symptoms will focus on the opposite motion.  Your physician, physical  therapist or athletic trainer will help you determine which exercises will be most helpful to resolve your lower back pain. Do not complete any exercises without first consulting with your caregiver. Discontinue any exercises which make your symptoms worse, until you speak to your caregiver. If you have pain, numbness or tingling which travels down into your buttocks, leg or foot, the goal of the therapy is for these symptoms to move closer to your back and eventually resolve. Sometimes, these leg symptoms will get better, but your lower back pain may worsen. This is often an indication of progress in your rehabilitation. Be very alert to any changes in your symptoms and the activities in which you participated in the 24 hours prior to the change. Sharing this information with your caregiver will allow him or her to most efficiently treat your condition. These exercises may help you when beginning to rehabilitate your injury. Your symptoms may resolve with or without further involvement from your physician, physical therapist or athletic trainer. While completing these exercises, remember:   Restoring tissue flexibility helps normal motion to return to the joints. This allows healthier, less painful movement and activity.  An effective stretch should be held for at least 30 seconds.  A stretch should never be painful. You should only feel a gentle lengthening or release in the stretched tissue. FLEXION RANGE OF MOTION AND STRETCHING EXERCISES: STRETCH - Flexion, Single Knee to Chest   Lie on a firm bed or floor with   both legs extended in front of you.  Keeping one leg in contact with the floor, bring your opposite knee to your chest. Hold your leg in place by either grabbing behind your thigh or at your knee.  Pull until you feel a gentle stretch in your low back. Hold __________ seconds.  Slowly release your grasp and repeat the exercise with the opposite side. Repeat __________ times. Complete  this exercise __________ times per day.  STRETCH - Flexion, Double Knee to Chest  Lie on a firm bed or floor with both legs extended in front of you.  Keeping one leg in contact with the floor, bring your opposite knee to your chest.  Tense your stomach muscles to support your back and then lift your other knee to your chest. Hold your legs in place by either grabbing behind your thighs or at your knees.  Pull both knees toward your chest until you feel a gentle stretch in your low back. Hold __________ seconds.  Tense your stomach muscles and slowly return one leg at a time to the floor. Repeat __________ times. Complete this exercise __________ times per day.  STRETCH - Low Trunk Rotation  Lie on a firm bed or floor. Keeping your legs in front of you, bend your knees so they are both pointed toward the ceiling and your feet are flat on the floor.  Extend your arms out to the side. This will stabilize your upper body by keeping your shoulders in contact with the floor.  Gently and slowly drop both knees together to one side until you feel a gentle stretch in your low back. Hold for __________ seconds.  Tense your stomach muscles to support your lower back as you bring your knees back to the starting position. Repeat the exercise to the other side. Repeat __________ times. Complete this exercise __________ times per day  EXTENSION RANGE OF MOTION AND FLEXIBILITY EXERCISES: STRETCH - Extension, Prone on Elbows   Lie on your stomach on the floor, a bed will be too soft. Place your palms about shoulder width apart and at the height of your head.  Place your elbows under your shoulders. If this is too painful, stack pillows under your chest.  Allow your body to relax so that your hips drop lower and make contact more completely with the floor.  Hold this position for __________ seconds.  Slowly return to lying flat on the floor. Repeat __________ times. Complete this exercise  __________ times per day.  RANGE OF MOTION - Extension, Prone Press Ups  Lie on your stomach on the floor, a bed will be too soft. Place your palms about shoulder width apart and at the height of your head.  Keeping your back as relaxed as possible, slowly straighten your elbows while keeping your hips on the floor. You may adjust the placement of your hands to maximize your comfort. As you gain motion, your hands will come more underneath your shoulders.  Hold this position __________ seconds.  Slowly return to lying flat on the floor. Repeat __________ times. Complete this exercise __________ times per day.  RANGE OF MOTION- Quadruped, Neutral Spine   Assume a hands and knees position on a firm surface. Keep your hands under your shoulders and your knees under your hips. You may place padding under your knees for comfort.  Drop your head and point your tailbone toward the ground below you. This will round out your lower back like an angry cat. Hold this position   for __________ seconds.  Slowly lift your head and release your tail bone so that your back sags into a large arch, like an old horse.  Hold this position for __________ seconds.  Repeat this until you feel limber in your low back.  Now, find your "sweet spot." This will be the most comfortable position somewhere between the two previous positions. This is your neutral spine. Once you have found this position, tense your stomach muscles to support your low back.  Hold this position for __________ seconds. Repeat __________ times. Complete this exercise __________ times per day.  STRENGTHENING EXERCISES - Low Back Sprain These exercises may help you when beginning to rehabilitate your injury. These exercises should be done near your "sweet spot." This is the neutral, low-back arch, somewhere between fully rounded and fully arched, that is your least painful position. When performed in this safe range of motion, these exercises  can be used for people who have either a flexion or extension based injury. These exercises may resolve your symptoms with or without further involvement from your physician, physical therapist or athletic trainer. While completing these exercises, remember:   Muscles can gain both the endurance and the strength needed for everyday activities through controlled exercises.  Complete these exercises as instructed by your physician, physical therapist or athletic trainer. Increase the resistance and repetitions only as guided.  You may experience muscle soreness or fatigue, but the pain or discomfort you are trying to eliminate should never worsen during these exercises. If this pain does worsen, stop and make certain you are following the directions exactly. If the pain is still present after adjustments, discontinue the exercise until you can discuss the trouble with your caregiver. STRENGTHENING - Deep Abdominals, Pelvic Tilt   Lie on a firm bed or floor. Keeping your legs in front of you, bend your knees so they are both pointed toward the ceiling and your feet are flat on the floor.  Tense your lower abdominal muscles to press your low back into the floor. This motion will rotate your pelvis so that your tail bone is scooping upwards rather than pointing at your feet or into the floor. With a gentle tension and even breathing, hold this position for __________ seconds. Repeat __________ times. Complete this exercise __________ times per day.  STRENGTHENING - Abdominals, Crunches   Lie on a firm bed or floor. Keeping your legs in front of you, bend your knees so they are both pointed toward the ceiling and your feet are flat on the floor. Cross your arms over your chest.  Slightly tip your chin down without bending your neck.  Tense your abdominals and slowly lift your trunk high enough to just clear your shoulder blades. Lifting higher can put excessive stress on the lower back and does not  further strengthen your abdominal muscles.  Control your return to the starting position. Repeat __________ times. Complete this exercise __________ times per day.  STRENGTHENING - Quadruped, Opposite UE/LE Lift   Assume a hands and knees position on a firm surface. Keep your hands under your shoulders and your knees under your hips. You may place padding under your knees for comfort.  Find your neutral spine and gently tense your abdominal muscles so that you can maintain this position. Your shoulders and hips should form a rectangle that is parallel with the floor and is not twisted.  Keeping your trunk steady, lift your right hand no higher than your shoulder and then your left   leg no higher than your hip. Make sure you are not holding your breath. Hold this position for __________ seconds.  Continuing to keep your abdominal muscles tense and your back steady, slowly return to your starting position. Repeat with the opposite arm and leg. Repeat __________ times. Complete this exercise __________ times per day.  STRENGTHENING - Abdominals and Quadriceps, Straight Leg Raise   Lie on a firm bed or floor with both legs extended in front of you.  Keeping one leg in contact with the floor, bend the other knee so that your foot can rest flat on the floor.  Find your neutral spine, and tense your abdominal muscles to maintain your spinal position throughout the exercise.  Slowly lift your straight leg off the floor about 6 inches for a count of 15, making sure to not hold your breath.  Still keeping your neutral spine, slowly lower your leg all the way to the floor. Repeat this exercise with each leg __________ times. Complete this exercise __________ times per day. POSTURE AND BODY MECHANICS CONSIDERATIONS - Low Back Sprain Keeping correct posture when sitting, standing or completing your activities will reduce the stress put on different body tissues, allowing injured tissues a chance to heal  and limiting painful experiences. The following are general guidelines for improved posture. Your physician or physical therapist will provide you with any instructions specific to your needs. While reading these guidelines, remember:  The exercises prescribed by your provider will help you have the flexibility and strength to maintain correct postures.  The correct posture provides the best environment for your joints to work. All of your joints have less wear and tear when properly supported by a spine with good posture. This means you will experience a healthier, less painful body.  Correct posture must be practiced with all of your activities, especially prolonged sitting and standing. Correct posture is as important when doing repetitive low-stress activities (typing) as it is when doing a single heavy-load activity (lifting). RESTING POSITIONS Consider which positions are most painful for you when choosing a resting position. If you have pain with flexion-based activities (sitting, bending, stooping, squatting), choose a position that allows you to rest in a less flexed posture. You would want to avoid curling into a fetal position on your side. If your pain worsens with extension-based activities (prolonged standing, working overhead), avoid resting in an extended position such as sleeping on your stomach. Most people will find more comfort when they rest with their spine in a more neutral position, neither too rounded nor too arched. Lying on a non-sagging bed on your side with a pillow between your knees, or on your back with a pillow under your knees will often provide some relief. Keep in mind, being in any one position for a prolonged period of time, no matter how correct your posture, can still lead to stiffness. PROPER SITTING POSTURE In order to minimize stress and discomfort on your spine, you must sit with correct posture. Sitting with good posture should be effortless for a healthy body.  Returning to good posture is a gradual process. Many people can work toward this most comfortably by using various supports until they have the flexibility and strength to maintain this posture on their own. When sitting with proper posture, your ears will fall over your shoulders and your shoulders will fall over your hips. You should use the back of the chair to support your upper back. Your lower back will be in a neutral   position, just slightly arched. You may place a small pillow or folded towel at the base of your lower back for  support.  When working at a desk, create an environment that supports good, upright posture. Without extra support, muscles tire, which leads to excessive strain on joints and other tissues. Keep these recommendations in mind: CHAIR:  A chair should be able to slide under your desk when your back makes contact with the back of the chair. This allows you to work closely.  The chair's height should allow your eyes to be level with the upper part of your monitor and your hands to be slightly lower than your elbows. BODY POSITION  Your feet should make contact with the floor. If this is not possible, use a foot rest.  Keep your ears over your shoulders. This will reduce stress on your neck and low back. INCORRECT SITTING POSTURES  If you are feeling tired and unable to assume a healthy sitting posture, do not slouch or slump. This puts excessive strain on your back tissues, causing more damage and pain. Healthier options include:  Using more support, like a lumbar pillow.  Switching tasks to something that requires you to be upright or walking.  Talking a brief walk.  Lying down to rest in a neutral-spine position. PROLONGED STANDING WHILE SLIGHTLY LEANING FORWARD  When completing a task that requires you to lean forward while standing in one place for a long time, place either foot up on a stationary 2-4 inch high object to help maintain the best posture. When  both feet are on the ground, the lower back tends to lose its slight inward curve. If this curve flattens (or becomes too large), then the back and your other joints will experience too much stress, tire more quickly, and can cause pain. CORRECT STANDING POSTURES Proper standing posture should be assumed with all daily activities, even if they only take a few moments, like when brushing your teeth. As in sitting, your ears should fall over your shoulders and your shoulders should fall over your hips. You should keep a slight tension in your abdominal muscles to brace your spine. Your tailbone should point down to the ground, not behind your body, resulting in an over-extended swayback posture.  INCORRECT STANDING POSTURES  Common incorrect standing postures include a forward head, locked knees and/or an excessive swayback. WALKING Walk with an upright posture. Your ears, shoulders and hips should all line-up. PROLONGED ACTIVITY IN A FLEXED POSITION When completing a task that requires you to bend forward at your waist or lean over a low surface, try to find a way to stabilize 3 out of 4 of your limbs. You can place a hand or elbow on your thigh or rest a knee on the surface you are reaching across. This will provide you more stability, so that your muscles do not tire as quickly. By keeping your knees relaxed, or slightly bent, you will also reduce stress across your lower back. CORRECT LIFTING TECHNIQUES DO :  Assume a wide stance. This will provide you more stability and the opportunity to get as close as possible to the object which you are lifting.  Tense your abdominals to brace your spine. Bend at the knees and hips. Keeping your back locked in a neutral-spine position, lift using your leg muscles. Lift with your legs, keeping your back straight.  Test the weight of unknown objects before attempting to lift them.  Try to keep your elbows locked down   at your sides in order get the best  strength from your shoulders when carrying an object.  Always ask for help when lifting heavy or awkward objects. INCORRECT LIFTING TECHNIQUES DO NOT:   Lock your knees when lifting, even if it is a small object.  Bend and twist. Pivot at your feet or move your feet when needing to change directions.  Assume that you can safely pick up even a paperclip without proper posture.   This information is not intended to replace advice given to you by your health care provider. Make sure you discuss any questions you have with your health care provider.   Document Released: 06/19/2005 Document Revised: 07/10/2014 Document Reviewed: 10/01/2008 [employer] Interactive Patient Education 2016 [employer]  

## 2016-02-06 LAB — URINE CULTURE

## 2016-02-08 ENCOUNTER — Encounter: Payer: Self-pay | Admitting: *Deleted

## 2016-02-17 ENCOUNTER — Other Ambulatory Visit: Payer: Self-pay | Admitting: Osteopathic Medicine

## 2016-02-17 DIAGNOSIS — M545 Low back pain, unspecified: Secondary | ICD-10-CM

## 2016-02-21 ENCOUNTER — Telehealth: Payer: Self-pay | Admitting: *Deleted

## 2016-02-21 NOTE — Telephone Encounter (Signed)
Patient returned call about urine culture.  Notified of results.  She is doing much better.

## 2016-05-08 ENCOUNTER — Encounter: Payer: Self-pay | Admitting: Obstetrics & Gynecology

## 2017-03-30 ENCOUNTER — Encounter: Payer: Self-pay | Admitting: Obstetrics & Gynecology

## 2017-03-30 ENCOUNTER — Ambulatory Visit (INDEPENDENT_AMBULATORY_CARE_PROVIDER_SITE_OTHER): Payer: BLUE CROSS/BLUE SHIELD | Admitting: Obstetrics & Gynecology

## 2017-03-30 VITALS — BP 132/86

## 2017-03-30 DIAGNOSIS — R21 Rash and other nonspecific skin eruption: Secondary | ICD-10-CM

## 2017-03-30 DIAGNOSIS — B9689 Other specified bacterial agents as the cause of diseases classified elsewhere: Secondary | ICD-10-CM | POA: Diagnosis not present

## 2017-03-30 DIAGNOSIS — N898 Other specified noninflammatory disorders of vagina: Secondary | ICD-10-CM

## 2017-03-30 DIAGNOSIS — L292 Pruritus vulvae: Secondary | ICD-10-CM

## 2017-03-30 DIAGNOSIS — N760 Acute vaginitis: Secondary | ICD-10-CM

## 2017-03-30 LAB — WET PREP FOR TRICH, YEAST, CLUE

## 2017-03-30 MED ORDER — TINIDAZOLE 500 MG PO TABS: 500.0000 mg | ORAL_TABLET | Freq: Two times a day (BID) | ORAL | 0 refills | Status: AC

## 2017-03-30 MED ORDER — FLUCONAZOLE 150 MG PO TABS: 150.0000 mg | ORAL_TABLET | Freq: Every day | ORAL | 1 refills | Status: AC

## 2017-03-30 MED ORDER — NYSTATIN 100000 UNIT/GM EX POWD: Freq: Two times a day (BID) | CUTANEOUS | 3 refills | Status: AC

## 2017-03-30 NOTE — Progress Notes (Signed)
    [name] July 16, 1960 [id number]        56 y.o.  G2P2   RP:  Vaginal discharge with vulvar itching and rash  HPI:  Used a new soap recently.  Developed a rash and itchiness at vulva and groin area after using it. Increased thick vaginal discharge. Tends to sweat a lot in groin area with hot weather which we had recently.  Has been soaking in warm bath for soothing, no treatment otherwise.  Past medical history,surgical history, problem list, medications, allergies, family history and social history were all reviewed and documented in the EPIC chart.  Directed ROS with pertinent positives and negatives documented in the history of present illness/assessment and plan.  Exam:  Vitals:   03/30/17 1520  BP: 132/86   General appearance:  Normal  Gyn exam:  Bilateral inguinal areas with rash Rt>Lt.                    Vulva:  Milder rash at labia majora.  Thick vaginal d/c out at vulva.  Wet prep done.                      Assessment/Plan:  56 y.o. G2P2   1. Vulvar and inguinal itching Vulvitis probably multifactorial, secondary to new soap, sweating, Yeast.  Recommend treatment with Fluconazol 150 mg per mouth daily x 3 days to be started right after the Tinidazole treatment.  Can continue with 1 tab per mouth every week x 3 weeks if symptoms not completely resolved.  Avoid irritants, warm soaking in bath as needed.  - WET PREP FOR TRICH, YEAST, CLUE  2. Vulvar and inguinal rash As above, Vulvitis probably multifactorial, secondary to new soap, sweating, Yeast.  Recommend treatment with Fluconazol 150 mg per mouth daily x 3 days to be started right after the Tinidazole treatment.  Can continue with 1 tab per mouth every week x 3 weeks if symptoms not completely resolved.  Avoid irritants, warm soaking in bath as needed.  3. Vaginal discharge Wet prep with Bacterial Vaginosis.  Will treat with Tinidazole.  Prescription sent, usage discussed. Probiotic tablet vaginally every  week as needed for prevention. - WET PREP FOR TRICH, YEAST, CLUE  4. Bacterial vaginosis As above  Counseling on above issues >50% x 15 minutes.  Genia Del MD, 3:35 PM 03/30/2017

## 2017-03-31 NOTE — Patient Instructions (Signed)
1. Vulvar and inguinal itching Vulvitis probably multifactorial, secondary to new soap, sweating, Yeast.  Recommend treatment with Fluconazol 150 mg per mouth daily x 3 days to be started right after the Tinidazole treatment.  Can continue with 1 tab per mouth every week x 3 weeks if symptoms not completely resolved.  Avoid irritants, warm soaking in bath as needed.  - WET PREP FOR TRICH, YEAST, CLUE  2. Vulvar and inguinal rash As above, Vulvitis probably multifactorial, secondary to new soap, sweating, Yeast.  Recommend treatment with Fluconazol 150 mg per mouth daily x 3 days to be started right after the Tinidazole treatment.  Can continue with 1 tab per mouth every week x 3 weeks if symptoms not completely resolved.  Avoid irritants, warm soaking in bath as needed.  3. Vaginal discharge Wet prep with Bacterial Vaginosis.  Will treat with Tinidazole.  Prescription sent, usage discussed. Probiotic tablet vaginally every week as needed for prevention. - WET PREP FOR TRICH, YEAST, CLUE  4. Bacterial vaginosis As above  [name], it was a pleasure to see you today!  I will see you again soon.   Bacterial Vaginosis Bacterial vaginosis is a vaginal infection that occurs when the normal balance of bacteria in the vagina is disrupted. It results from an overgrowth of certain bacteria. This is the most common vaginal infection among women ages 8-44. Because bacterial vaginosis increases your risk for STIs (sexually transmitted infections), getting treated can help reduce your risk for chlamydia, gonorrhea, herpes, and HIV (human immunodeficiency virus). Treatment is also important for preventing complications in pregnant women, because this condition can cause an early (premature) delivery. What are the causes? This condition is caused by an increase in harmful bacteria that are normally present in small amounts in the vagina. However, the reason that the condition develops is not fully  understood. What increases the risk? The following factors may make you more likely to develop this condition:  Having a new sexual partner or multiple sexual partners.  Having unprotected sex.  Douching.  Having an intrauterine device (IUD).  Smoking.  Drug and alcohol abuse.  Taking certain antibiotic medicines.  Being pregnant.  You cannot get bacterial vaginosis from toilet seats, bedding, swimming pools, or contact with objects around you. What are the signs or symptoms? Symptoms of this condition include:  Grey or white vaginal discharge. The discharge can also be watery or foamy.  A fish-like odor with discharge, especially after sexual intercourse or during menstruation.  Itching in and around the vagina.  Burning or pain with urination.  Some women with bacterial vaginosis have no signs or symptoms. How is this diagnosed? This condition is diagnosed based on:  Your medical history.  A physical exam of the vagina.  Testing a sample of vaginal fluid under a microscope to look for a large amount of bad bacteria or abnormal cells. Your health care provider may use a cotton swab or a small wooden spatula to collect the sample.  How is this treated? This condition is treated with antibiotics. These may be given as a pill, a vaginal cream, or a medicine that is put into the vagina (suppository). If the condition comes back after treatment, a second round of antibiotics may be needed. Follow these instructions at home: Medicines  Take over-the-counter and prescription medicines only as told by your health care provider.  Take or use your antibiotic as told by your health care provider. Do not stop taking or using the antibiotic even if  you start to feel better. General instructions  If you have a female sexual partner, tell her that you have a vaginal infection. She should see her health care provider and be treated if she has symptoms. If you have a female sexual  partner, he does not need treatment.  During treatment: ? Avoid sexual activity until you finish treatment. ? Do not douche. ? Avoid alcohol as directed by your health care provider. ? Avoid breastfeeding as directed by your health care provider.  Drink enough water and fluids to keep your urine clear or pale yellow.  Keep the area around your vagina and rectum clean. ? Wash the area daily with warm water. ? Wipe yourself from front to back after using the toilet.  Keep all follow-up visits as told by your health care provider. This is important. How is this prevented?  Do not douche.  Wash the outside of your vagina with warm water only.  Use protection when having sex. This includes latex condoms and dental dams.  Limit how many sexual partners you have. To help prevent bacterial vaginosis, it is best to have sex with just one partner (monogamous).  Make sure you and your sexual partner are tested for STIs.  Wear cotton or cotton-lined underwear.  Avoid wearing tight pants and pantyhose, especially during summer.  Limit the amount of alcohol that you drink.  Do not use any products that contain nicotine or tobacco, such as cigarettes and e-cigarettes. If you need help quitting, ask your health care provider.  Do not use illegal drugs. Where to find more information:  Centers for Disease Control and Prevention: SolutionApps.co.za  American Sexual Health Association (ASHA): www.ashastd.org  [place] Department of Health and Health and safety inspector, Office on Women's Health: ConventionalMedicines.si or http://www.anderson-williamson.info/ Contact a health care provider if:  Your symptoms do not improve, even after treatment.  You have more discharge or pain when urinating.  You have a fever.  You have pain in your abdomen.  You have pain during sex.  You have vaginal bleeding between periods. Summary  Bacterial vaginosis is a vaginal infection that occurs  when the normal balance of bacteria in the vagina is disrupted.  Because bacterial vaginosis increases your risk for STIs (sexually transmitted infections), getting treated can help reduce your risk for chlamydia, gonorrhea, herpes, and HIV (human immunodeficiency virus). Treatment is also important for preventing complications in pregnant women, because the condition can cause an early (premature) delivery.  This condition is treated with antibiotic medicines. These may be given as a pill, a vaginal cream, or a medicine that is put into the vagina (suppository). This information is not intended to replace advice given to you by your health care provider. Make sure you discuss any questions you have with your health care provider. Document Released: 06/19/2005 Document Revised: 03/04/2016 Document Reviewed: 03/04/2016 Elsevier Interactive Patient Education  2017 [employer]  Skin Yeast Infection Skin yeast infection is a condition in which there is an overgrowth of yeast (candida) that normally lives on the skin. This condition usually occurs in areas of the skin that are constantly warm and moist, such as the armpits or the groin. What are the causes? This condition is caused by a change in the normal balance of the yeast and bacteria that live on the skin. What increases the risk? This condition is more likely to develop in:  People who are obese.  Pregnant women.  Women who take birth control pills.  People who have diabetes.  People who take antibiotic medicines.  People who take steroid medicines.  People who are malnourished.  People who have a weak defense (immune) system.  People who are 55 years of age or older.  What are the signs or symptoms? Symptoms of this condition include:  A red, swollen area of the skin.  Bumps on the skin.  Itchiness.  How is this diagnosed? This condition is diagnosed with a medical history and physical exam. Your health care  provider may check for yeast by taking light scrapings of the skin to be viewed under a microscope. How is this treated? This condition is treated with medicine. Medicines may be prescribed or be available over-the-counter. The medicines may be:  Taken by mouth (orally).  Applied as a cream.  Follow these instructions at home:  Take or apply over-the-counter and prescription medicines only as told by your health care provider.  Eat more yogurt. This may help to keep your yeast infection from returning.  Maintain a healthy weight. If you need help losing weight, talk with your health care provider.  Keep your skin clean and dry.  If you have diabetes, keep your blood sugar under control. Contact a health care provider if:  Your symptoms go away and then return.  Your symptoms do not get better with treatment.  Your symptoms get worse.  Your rash spreads.  You have a fever or chills.  You have new symptoms.  You have new warmth or redness of your skin. This information is not intended to replace advice given to you by your health care provider. Make sure you discuss any questions you have with your health care provider. Document Released: 03/07/2011 Document Revised: 02/13/2016 Document Reviewed: 12/21/2014 Elsevier Interactive Patient Education  [employer].

## 2017-04-13 ENCOUNTER — Ambulatory Visit (INDEPENDENT_AMBULATORY_CARE_PROVIDER_SITE_OTHER): Payer: BLUE CROSS/BLUE SHIELD | Admitting: Obstetrics & Gynecology

## 2017-04-13 ENCOUNTER — Encounter: Payer: Self-pay | Admitting: Obstetrics & Gynecology

## 2017-04-13 VITALS — Ht 65.0 in | Wt 198.0 lb

## 2017-04-13 DIAGNOSIS — Z01419 Encounter for gynecological examination (general) (routine) without abnormal findings: Secondary | ICD-10-CM

## 2017-04-13 DIAGNOSIS — Z113 Encounter for screening for infections with a predominantly sexual mode of transmission: Secondary | ICD-10-CM | POA: Diagnosis not present

## 2017-04-13 DIAGNOSIS — Z780 Asymptomatic menopausal state: Secondary | ICD-10-CM | POA: Diagnosis not present

## 2017-04-13 NOTE — Patient Instructions (Signed)
1. Encounter for routine gynecological examination with Papanicolaou smear of cervix Normal gyn exam.  Pap/HPV done.  Breasts wnl.  Screening Mammo scheduled next week.  2. Menopause present No HRT.  No PMB.  Vit D supplement/Ca++ in food/Weight bearing physical activity.  F/U Bone Density. - DG Bone Density; Future  3. Screen for STD (sexually transmitted disease) Continue to use condoms -Gono-Chlam - HIV antibody (with reflex) - RPR - Hepatitis B Surface AntiGEN - Hepatitis C Antibody  Good to see you well [name]!  I will inform you of all your results as soon as available.   Health Maintenance for Postmenopausal Women Menopause is a normal process in which your reproductive ability comes to an end. This process happens gradually over a span of months to years, usually between the ages of 24 and 87. Menopause is complete when you have missed 12 consecutive menstrual periods. It is important to talk with your health care provider about some of the most common conditions that affect postmenopausal women, such as heart disease, cancer, and bone loss (osteoporosis). Adopting a healthy lifestyle and getting preventive care can help to promote your health and wellness. Those actions can also lower your chances of developing some of these common conditions. What should I know about menopause? During menopause, you may experience a number of symptoms, such as:  Moderate-to-severe hot flashes.  Night sweats.  Decrease in sex drive.  Mood swings.  Headaches.  Tiredness.  Irritability.  Memory problems.  Insomnia.  Choosing to treat or not to treat menopausal changes is an individual decision that you make with your health care provider. What should I know about hormone replacement therapy and supplements? Hormone therapy products are effective for treating symptoms that are associated with menopause, such as hot flashes and night sweats. Hormone replacement carries certain  risks, especially as you become older. If you are thinking about using estrogen or estrogen with progestin treatments, discuss the benefits and risks with your health care provider. What should I know about heart disease and stroke? Heart disease, heart attack, and stroke become more likely as you age. This may be due, in part, to the hormonal changes that your body experiences during menopause. These can affect how your body processes dietary fats, triglycerides, and cholesterol. Heart attack and stroke are both medical emergencies. There are many things that you can do to help prevent heart disease and stroke:  Have your blood pressure checked at least every 1-2 years. High blood pressure causes heart disease and increases the risk of stroke.  If you are 52-52 years old, ask your health care provider if you should take aspirin to prevent a heart attack or a stroke.  Do not use any tobacco products, including cigarettes, chewing tobacco, or electronic cigarettes. If you need help quitting, ask your health care provider.  It is important to eat a healthy diet and maintain a healthy weight. ? Be sure to include plenty of vegetables, fruits, low-fat dairy products, and lean protein. ? Avoid eating foods that are high in solid fats, added sugars, or salt (sodium).  Get regular exercise. This is one of the most important things that you can do for your health. ? Try to exercise for at least 150 minutes each week. The type of exercise that you do should increase your heart rate and make you sweat. This is known as moderate-intensity exercise. ? Try to do strengthening exercises at least twice each week. Do these in addition to the moderate-intensity exercise.  Know your numbers.Ask your health care provider to check your cholesterol and your blood glucose. Continue to have your blood tested as directed by your health care provider.  What should I know about cancer screening? There are several types  of cancer. Take the following steps to reduce your risk and to catch any cancer development as early as possible. Breast Cancer  Practice breast self-awareness. ? This means understanding how your breasts normally appear and feel. ? It also means doing regular breast self-exams. Let your health care provider know about any changes, no matter how small.  If you are 51 or older, have a clinician do a breast exam (clinical breast exam or CBE) every year. Depending on your age, family history, and medical history, it may be recommended that you also have a yearly breast X-ray (mammogram).  If you have a family history of breast cancer, talk with your health care provider about genetic screening.  If you are at high risk for breast cancer, talk with your health care provider about having an MRI and a mammogram every year.  Breast cancer (BRCA) gene test is recommended for women who have family members with BRCA-related cancers. Results of the assessment will determine the need for genetic counseling and BRCA1 and for BRCA2 testing. BRCA-related cancers include these types: ? Breast. This occurs in males or females. ? Ovarian. ? Tubal. This may also be called fallopian tube cancer. ? Cancer of the abdominal or pelvic lining (peritoneal cancer). ? Prostate. ? Pancreatic.  Cervical, Uterine, and Ovarian Cancer Your health care provider may recommend that you be screened regularly for cancer of the pelvic organs. These include your ovaries, uterus, and vagina. This screening involves a pelvic exam, which includes checking for microscopic changes to the surface of your cervix (Pap test).  For women ages 21-65, health care providers may recommend a pelvic exam and a Pap test every three years. For women ages 63-65, they may recommend the Pap test and pelvic exam, combined with testing for human papilloma virus (HPV), every five years. Some types of HPV increase your risk of cervical cancer. Testing for  HPV may also be done on women of any age who have unclear Pap test results.  Other health care providers may not recommend any screening for nonpregnant women who are considered low risk for pelvic cancer and have no symptoms. Ask your health care provider if a screening pelvic exam is right for you.  If you have had past treatment for cervical cancer or a condition that could lead to cancer, you need Pap tests and screening for cancer for at least 20 years after your treatment. If Pap tests have been discontinued for you, your risk factors (such as having a new sexual partner) need to be reassessed to determine if you should start having screenings again. Some women have medical problems that increase the chance of getting cervical cancer. In these cases, your health care provider may recommend that you have screening and Pap tests more often.  If you have a family history of uterine cancer or ovarian cancer, talk with your health care provider about genetic screening.  If you have vaginal bleeding after reaching menopause, tell your health care provider.  There are currently no reliable tests available to screen for ovarian cancer.  Lung Cancer Lung cancer screening is recommended for adults 72-24 years old who are at high risk for lung cancer because of a history of smoking. A yearly low-dose CT scan of the  lungs is recommended if you:  Currently smoke.  Have a history of at least 30 pack-years of smoking and you currently smoke or have quit within the past 15 years. A pack-year is smoking an average of one pack of cigarettes per day for one year.  Yearly screening should:  Continue until it has been 15 years since you quit.  Stop if you develop a health problem that would prevent you from having lung cancer treatment.  Colorectal Cancer  This type of cancer can be detected and can often be prevented.  Routine colorectal cancer screening usually begins at age 20 and continues through  age 62.  If you have risk factors for colon cancer, your health care provider may recommend that you be screened at an earlier age.  If you have a family history of colorectal cancer, talk with your health care provider about genetic screening.  Your health care provider may also recommend using home test kits to check for hidden blood in your stool.  A small camera at the end of a tube can be used to examine your colon directly (sigmoidoscopy or colonoscopy). This is done to check for the earliest forms of colorectal cancer.  Direct examination of the colon should be repeated every 5-10 years until age 5. However, if early forms of precancerous polyps or small growths are found or if you have a family history or genetic risk for colorectal cancer, you may need to be screened more often.  Skin Cancer  Check your skin from head to toe regularly.  Monitor any moles. Be sure to tell your health care provider: ? About any new moles or changes in moles, especially if there is a change in a mole's shape or color. ? If you have a mole that is larger than the size of a pencil eraser.  If any of your family members has a history of skin cancer, especially at a young age, talk with your health care provider about genetic screening.  Always use sunscreen. Apply sunscreen liberally and repeatedly throughout the day.  Whenever you are outside, protect yourself by wearing long sleeves, pants, a wide-brimmed hat, and sunglasses.  What should I know about osteoporosis? Osteoporosis is a condition in which bone destruction happens more quickly than new bone creation. After menopause, you may be at an increased risk for osteoporosis. To help prevent osteoporosis or the bone fractures that can happen because of osteoporosis, the following is recommended:  If you are 41-31 years old, get at least 1,000 mg of calcium and at least 600 mg of vitamin D per day.  If you are older than age 43 but younger than  age 44, get at least 1,200 mg of calcium and at least 600 mg of vitamin D per day.  If you are older than age 11, get at least 1,200 mg of calcium and at least 800 mg of vitamin D per day.  Smoking and excessive alcohol intake increase the risk of osteoporosis. Eat foods that are rich in calcium and vitamin D, and do weight-bearing exercises several times each week as directed by your health care provider. What should I know about how menopause affects my mental health? Depression may occur at any age, but it is more common as you become older. Common symptoms of depression include:  Low or sad mood.  Changes in sleep patterns.  Changes in appetite or eating patterns.  Feeling an overall lack of motivation or enjoyment of activities that you previously  enjoyed.  Frequent crying spells.  Talk with your health care provider if you think that you are experiencing depression. What should I know about immunizations? It is important that you get and maintain your immunizations. These include:  Tetanus, diphtheria, and pertussis (Tdap) booster vaccine.  Influenza every year before the flu season begins.  Pneumonia vaccine.  Shingles vaccine.  Your health care provider may also recommend other immunizations. This information is not intended to replace advice given to you by your health care provider. Make sure you discuss any questions you have with your health care provider. Document Released: 08/11/2005 Document Revised: 01/07/2016 Document Reviewed: 03/23/2015 Elsevier Interactive Patient Education  2018 [employer].

## 2017-04-13 NOTE — Progress Notes (Signed)
[name] 04/04/1961 [id number]   History:    56 y.o. G2P2L2 Divorced.  S/P BT/S  RP:  Established patient presenting for annual gyn exam   HPI:  Menopause. No HRT.  No PMB.  No pelvic pain.  Resolved severe vulvitis post treatment 2 weeks ago.  Rarely sexually active, using condoms.  Breasts wnl.  Mictions/BMs wnl.  Past medical history,surgical history, family history and social history were all reviewed and documented in the EPIC chart.  Gynecologic History No LMP recorded. Patient is postmenopausal. Contraception: post menopausal status and tubal ligation Last Pap: 2015. Results were: normal Last mammogram: Scheduled next week. Results were: Not done yet Bone Density to schedule here [place] 2016-17  Obstetric History OB History  Gravida Para Term Preterm AB Living  SAB TAB Ectopic Multiple Live Births               # Outcome Date GA Lbr Len/2nd Weight Sex Delivery Anes PTL Lv  2 Para           1 Para                ROS: A ROS was performed and pertinent positives and negatives are included in the history.  GENERAL: No fevers or chills. HEENT: No change in vision, no earache, sore throat or sinus congestion. NECK: No pain or stiffness. CARDIOVASCULAR: No chest pain or pressure. No palpitations. PULMONARY: No shortness of breath, cough or wheeze. GASTROINTESTINAL: No abdominal pain, nausea, vomiting or diarrhea, melena or bright red blood per rectum. GENITOURINARY: No urinary frequency, urgency, hesitancy or dysuria. MUSCULOSKELETAL: No joint or muscle pain, no back pain, no recent trauma. DERMATOLOGIC: No rash, no itching, no lesions. ENDOCRINE: No polyuria, polydipsia, no heat or cold intolerance. No recent change in weight. HEMATOLOGICAL: No anemia or easy bruising or bleeding. NEUROLOGIC: No headache, seizures, numbness, tingling or weakness. PSYCHIATRIC: No depression, no loss of interest in normal activity or change in sleep pattern.      Exam:   Ht  (1.651 m)   Wt 198 lb (89.8 kg)   BMI 32.95 kg/m   Body mass index is 32.95 kg/m.  General appearance : Well developed well nourished female. No acute distress HEENT: Eyes: no retinal hemorrhage or exudates,  Neck supple, trachea midline, no carotid bruits, no thyroidmegaly Lungs: Clear to auscultation, no rhonchi or wheezes, or rib retractions  Heart: Regular rate and rhythm, no murmurs or gallops Breast:Examined in sitting and supine position were symmetrical in appearance, no palpable masses or tenderness,  no skin retraction, no nipple inversion, no nipple discharge, no skin discoloration, no axillary or supraclavicular lymphadenopathy Abdomen: no palpable masses or tenderness, no rebound or guarding Extremities: no edema or skin discoloration or tenderness  Pelvic: Vulva normal  Bartholin, Urethra, Skene Glands: Within normal limits             Vagina: No gross lesions or discharge  Cervix: No gross lesions or discharge.  Pap/HPV/Gono-Chlam done  Uterus  AV, normal size, shape and consistency, non-tender and mobile  Adnexa  Without masses or tenderness  Anus and perineum  normal   Assessment/Plan:  56 y.o. female for annual exam   1. Encounter for routine gynecological examination with Papanicolaou smear of cervix Normal gyn exam.  Pap/HPV done.  Breasts wnl.  Screening Mammo scheduled next week.  2. Menopause present No HRT.  No PMB.  Vit D supplement/Ca++ in food/Weight  bearing physical activity.  F/U Bone Density. - DG Bone Density; Future  3. Screen for STD (sexually transmitted disease) Continue to use condoms -Gono-Chlam - HIV antibody (with reflex) - RPR - Hepatitis B Surface AntiGEN - Hepatitis C Antibody  Genia Del MD, 11:22 AM 04/13/2017

## 2017-04-13 NOTE — Addendum Note (Signed)
Addended by: Kem Parkinson on: 04/13/2017 11:56 AM   Modules accepted: Orders

## 2017-04-16 LAB — HEPATITIS C ANTIBODY
HEP C AB: NONREACTIVE
SIGNAL TO CUT-OFF: 0.07 (ref <1.00)

## 2017-04-16 LAB — RPR: RPR Ser Ql: NONREACTIVE

## 2017-04-16 LAB — HEPATITIS B SURFACE ANTIGEN: Hepatitis B Surface Ag: NONREACTIVE

## 2017-04-16 LAB — HIV ANTIBODY (ROUTINE TESTING W REFLEX): HIV 1&2 Ab, 4th Generation: NONREACTIVE

## 2017-04-17 LAB — PAP IG, CT-NG NAA, HPV HIGH-RISK
C. trachomatis RNA, TMA: NOT DETECTED
HPV DNA HIGH RISK: NOT DETECTED
N. gonorrhoeae RNA, TMA: NOT DETECTED

## 2017-04-24 ENCOUNTER — Ambulatory Visit (INDEPENDENT_AMBULATORY_CARE_PROVIDER_SITE_OTHER): Payer: BLUE CROSS/BLUE SHIELD | Admitting: Family Medicine

## 2017-04-24 ENCOUNTER — Encounter: Payer: Self-pay | Admitting: Family Medicine

## 2017-04-24 VITALS — HR 88 | Temp 98.2°F | Resp 16 | Ht 65.0 in | Wt 194.8 lb

## 2017-04-24 DIAGNOSIS — R300 Dysuria: Secondary | ICD-10-CM | POA: Diagnosis not present

## 2017-04-24 LAB — POCT URINALYSIS DIP (MANUAL ENTRY)
Bilirubin, UA: NEGATIVE
Glucose, UA: NEGATIVE mg/dL
Ketones, POC UA: NEGATIVE mg/dL
Nitrite, UA: NEGATIVE
Protein Ur, POC: NEGATIVE mg/dL
Spec Grav, UA: 1.02 (ref 1.010–1.025)
Urobilinogen, UA: 0.2 E.U./dL
pH, UA: 6.5 (ref 5.0–8.0)

## 2017-04-24 MED ORDER — NITROFURANTOIN MONOHYD MACRO 100 MG PO CAPS: 100.0000 mg | ORAL_CAPSULE | Freq: Two times a day (BID) | ORAL | 0 refills | Status: DC

## 2017-04-24 NOTE — Progress Notes (Signed)
10/23/20184:21 PM  [name] 1961/02/12, 56 y.o. female [id number]  Chief Complaint  Patient presents with  . Back Pain    abdominal pain started Friday vaginal itching Thursday,     HPI:   Patient is a 56 y.o. female who presents today for 5 days of suprapubic pain, dysuria, low back pain, getting worse. Patient reports that it started after urethral irritation from new fabric softner, was using OTC yeast infection medication wo relief, denies having vaginal discharge.  Depression screen [facility] 2/9 04/24/2017 02/05/2016  Decreased Interest 0 0  Down, Depressed, Hopeless 0 0  PHQ - 2 Score 0 0    Allergies  Allergen Reactions  . Codeine Nausea And Vomiting    Prior to Admission medications   Medication Sig Start Date End Date Taking? Authorizing Provider  lisinopril (PRINIVIL,ZESTRIL) 10 MG tablet Take 10 mg by mouth.    [provider]    Past Medical History:  Diagnosis Date  . Allergy     Past Surgical History:  Procedure Laterality Date  . LUNG SURGERY  2008    Social History  Substance Use Topics  . Smoking status: Never Smoker  . Smokeless tobacco: Never Used  . Alcohol use No    Family History  Problem Relation Age of Onset  . Breast cancer Mother 87  . Hypertension Mother   . Hypertension Father     Review of Systems  Constitutional: Negative for chills and fever.  Gastrointestinal: Positive for abdominal pain. Negative for nausea and vomiting.  Genitourinary: Positive for dysuria. Negative for flank pain and hematuria.  Musculoskeletal: Positive for back pain.     OBJECTIVE:  Pulse 88, temperature 98.2 F (36.8 C), resp. rate 16, height 5\' 5"  (1.651 m), weight 194 lb 12.8 oz (88.4 kg), SpO2 98 %.  Physical Exam  Constitutional: She is oriented to person, place, and time and well-developed, well-nourished, and in no distress.  HENT:  Head: Normocephalic and atraumatic.  Mouth/Throat: Oropharynx is clear and moist. No  oropharyngeal exudate.  Eyes: Pupils are equal, round, and reactive to light. EOM are normal. No scleral icterus.  Neck: Neck supple.  Cardiovascular: Normal rate, regular rhythm and normal heart sounds.  Exam reveals no gallop and no friction rub.   No murmur heard. Pulmonary/Chest: Effort normal and breath sounds normal. She has no wheezes. She has no rales.  Abdominal: There is no CVA tenderness.  Musculoskeletal: She exhibits no edema.  Neurological: She is alert and oriented to person, place, and time. Gait normal.  Skin: Skin is warm and dry.    Results for orders placed or performed in visit on 04/24/17 (from the past 24 hour(s))  POCT urinalysis dipstick     Status: Abnormal   Collection Time: 04/24/17  4:18 PM  Result Value Ref Range   Color, UA yellow yellow   Clarity, UA cloudy (A) clear   Glucose, UA negative negative mg/dL   Bilirubin, UA negative negative   Ketones, POC UA negative negative mg/dL   Spec Grav, UA 4.098 1.191 - 1.025   Blood, UA small (A) negative   pH, UA 6.5 5.0 - 8.0   Protein Ur, POC negative negative mg/dL   Urobilinogen, UA 0.2 0.2 or 1.0 E.U./dL   Nitrite, UA Negative Negative   Leukocytes, UA Trace (A) Negative    No results found.   ASSESSMENT and PLAN  1. Dysuria Discussed supporitve measures, new abx r/se/b. RTC precautions discussed.  - POCT urinalysis dipstick -  Urine Culture  - nitrofurantoin, macrocrystal-monohydrate, (MACROBID) 100 MG capsule; Take 1 capsule (100 mg total) by mouth 2 (two) times daily.  Return if symptoms worsen or fail to improve.    Myles LippsIrma M Santiago, MD [facility] [address] [place], [place] [address] Ph.  [phone] Fax [phone]

## 2017-04-24 NOTE — Patient Instructions (Signed)
     IF you received an x-ray today, you will receive an invoice from [facility]. Please contact  Radiology at [phone] with questions or concerns regarding your invoice.   IF you received labwork today, you will receive an invoice from [facility]. Please contact [facility] at [phone] with questions or concerns regarding your invoice.   Our billing staff will not be able to assist you with questions regarding bills from these companies.  You will be contacted with the lab results as soon as they are available. The fastest way to get your results is to activate your My Chart account. Instructions are located on the last page of this paperwork. If you have not heard from us regarding the results in 2 weeks, please contact this office.     

## 2017-04-25 LAB — URINE CULTURE

## 2017-04-27 ENCOUNTER — Telehealth: Payer: Self-pay | Admitting: Family Medicine

## 2017-04-27 NOTE — Telephone Encounter (Signed)
Pt is needing something for a yeast infection that the anibiotics have caused   Best number [phone]

## 2017-05-01 MED ORDER — FLUCONAZOLE 150 MG PO TABS: 150.0000 mg | ORAL_TABLET | Freq: Once | ORAL | 0 refills | Status: AC

## 2017-05-01 NOTE — Telephone Encounter (Signed)
Please advise 

## 2017-05-01 NOTE — Telephone Encounter (Signed)
Left detailed VM.  

## 2017-06-04 ENCOUNTER — Encounter: Payer: Self-pay | Admitting: Obstetrics & Gynecology

## 2018-07-05 ENCOUNTER — Ambulatory Visit (INDEPENDENT_AMBULATORY_CARE_PROVIDER_SITE_OTHER): Payer: BLUE CROSS/BLUE SHIELD | Admitting: Obstetrics & Gynecology

## 2018-07-05 ENCOUNTER — Encounter: Payer: Self-pay | Admitting: Obstetrics & Gynecology

## 2018-07-05 VITALS — BP 138/88 | Ht 64.0 in | Wt 194.0 lb

## 2018-07-05 DIAGNOSIS — Z01419 Encounter for gynecological examination (general) (routine) without abnormal findings: Secondary | ICD-10-CM

## 2018-07-05 DIAGNOSIS — Z780 Asymptomatic menopausal state: Secondary | ICD-10-CM

## 2018-07-05 DIAGNOSIS — Z13820 Encounter for screening for osteoporosis: Secondary | ICD-10-CM

## 2018-07-05 DIAGNOSIS — E6609 Other obesity due to excess calories: Secondary | ICD-10-CM | POA: Diagnosis not present

## 2018-07-05 DIAGNOSIS — Z113 Encounter for screening for infections with a predominantly sexual mode of transmission: Secondary | ICD-10-CM

## 2018-07-05 DIAGNOSIS — Z6833 Body mass index (BMI) 33.0-33.9, adult: Secondary | ICD-10-CM

## 2018-07-05 DIAGNOSIS — Z1151 Encounter for screening for human papillomavirus (HPV): Secondary | ICD-10-CM

## 2018-07-05 NOTE — Progress Notes (Signed)
[name] 1961-06-14 [id number]   History:    58 y.o. G2P2L2 Divorced.  Fiance in [place]  RP:  Established patient presenting for annual gyn exam   HPI: Menopause x 40, well on no HRT.  No PMB.  No pelvic pain.  Using condoms when sexually active.  No pain with IC.  Breasts normal.  Urine/BMs normal.  BMI 33.3.  Not exercising on a regular basis.  Health labs with Fam MD.  Past medical history,surgical history, family history and social history were all reviewed and documented in the EPIC chart.  Gynecologic History No LMP recorded. Patient is postmenopausal. Contraception: condoms and post menopausal status Last Pap: 04/2017. Results were: Negative/HPV HR neg Last mammogram: 06/07/2018. Results were: Negative Bone Density: Never, will schedule now. Colonoscopy: 2017  Obstetric History OB History  Gravida Para Term Preterm AB Living  2 2       2   SAB TAB Ectopic Multiple Live Births               # Outcome Date GA Lbr Len/2nd Weight Sex Delivery Anes PTL Lv  2 Para           1 Para              ROS: A ROS was performed and pertinent positives and negatives are included in the history.  GENERAL: No fevers or chills. HEENT: No change in vision, no earache, sore throat or sinus congestion. NECK: No pain or stiffness. CARDIOVASCULAR: No chest pain or pressure. No palpitations. PULMONARY: No shortness of breath, cough or wheeze. GASTROINTESTINAL: No abdominal pain, nausea, vomiting or diarrhea, melena or bright red blood per rectum. GENITOURINARY: No urinary frequency, urgency, hesitancy or dysuria. MUSCULOSKELETAL: No joint or muscle pain, no back pain, no recent trauma. DERMATOLOGIC: No rash, no itching, no lesions. ENDOCRINE: No polyuria, polydipsia, no heat or cold intolerance. No recent change in weight. HEMATOLOGICAL: No anemia or easy bruising or bleeding. NEUROLOGIC: No headache, seizures, numbness, tingling or weakness. PSYCHIATRIC: No depression, no loss of interest  in normal activity or change in sleep pattern.     Exam:   BP 138/88   Ht 5\' 4"  (1.626 m)   Wt 194 lb (88 kg)   BMI 33.30 kg/m   Body mass index is 33.3 kg/m.  General appearance : Well developed well nourished female. No acute distress HEENT: Eyes: no retinal hemorrhage or exudates,  Neck supple, trachea midline, no carotid bruits, no thyroidmegaly Lungs: Clear to auscultation, no rhonchi or wheezes, or rib retractions  Heart: Regular rate and rhythm, no murmurs or gallops Breast:Examined in sitting and supine position were symmetrical in appearance, no palpable masses or tenderness,  no skin retraction, no nipple inversion, no nipple discharge, no skin discoloration, no axillary or supraclavicular lymphadenopathy Abdomen: no palpable masses or tenderness, no rebound or guarding Extremities: no edema or skin discoloration or tenderness  Pelvic: Vulva: Normal             Vagina: No gross lesions or discharge  Cervix: No gross lesions or discharge.  Pap/HPV HR, Gono-Chlam done  Uterus AV, normal size, shape and consistency, non-tender and mobile  Adnexa  Without masses or tenderness  Anus: Normal   Assessment/Plan:  58 y.o. female for annual exam   1. Encounter for routine gynecological examination with Papanicolaou smear of cervix Normal gynecologic exam in menopause.  Pap with high-risk HPV done today.  Breast exam normal.  Screening mammogram in December 2019 was negative.  Colonoscopy in 2017.  Health labs with family physician.  2. Postmenopausal Well on no hormone replacement therapy.  No postmenopausal bleeding.  3. Screening for osteoporosis Menopausal since age 58.  We will proceed with a bone density here.  Recommend vitamin D supplements, calcium intake of 1.5 g/day including nutritional and supplemental, regular weightbearing physical activities.  4. Class 1 obesity due to excess calories without serious comorbidity with body mass index (BMI) of 33.0 to 33.9 in  adult Recommend a lower calorie/carb diet such as [employer] diet.  Increase physical activity with aerobic activities 5 times a week and weightlifting every 2 days.  Genia DelMarie-Lyne Vyla Pint MD, 8:52 AM 07/05/2018

## 2018-07-05 NOTE — Addendum Note (Signed)
Addended by: Berna Spare A on: 07/05/2018 10:32 AM   Modules accepted: Orders

## 2018-07-05 NOTE — Patient Instructions (Signed)
1. Encounter for routine gynecological examination with Papanicolaou smear of cervix Normal gynecologic exam in menopause.  Pap with high-risk HPV done today.  Breast exam normal.  Screening mammogram in December 2019 was negative.  Colonoscopy in 2017.  Health labs with family physician.  2. Postmenopausal Well on no hormone replacement therapy.  No postmenopausal bleeding.  3. Screening for osteoporosis Menopausal since age 58.  We will proceed with a bone density here.  Recommend vitamin D supplements, calcium intake of 1.5 g/day including nutritional and supplemental, regular weightbearing physical activities.  4. Class 1 obesity due to excess calories without serious comorbidity with body mass index (BMI) of 33.0 to 33.9 in adult Recommend a lower calorie/carb diet such as [employer].  Increase physical activity with aerobic activities 5 times a week and weightlifting every 2 days.  [name], it was a pleasure seeing you today!  I will inform you of your results as soon as they are available.

## 2018-07-08 ENCOUNTER — Other Ambulatory Visit: Payer: Self-pay | Admitting: *Deleted

## 2018-07-08 DIAGNOSIS — Z13820 Encounter for screening for osteoporosis: Secondary | ICD-10-CM

## 2018-07-08 LAB — PAP IG, CT-NG NAA, HPV HIGH-RISK
C. TRACHOMATIS RNA, TMA: NOT DETECTED
HPV DNA HIGH RISK: NOT DETECTED
N. GONORRHOEAE RNA, TMA: NOT DETECTED

## 2018-08-22 ENCOUNTER — Other Ambulatory Visit: Payer: Self-pay | Admitting: Obstetrics & Gynecology

## 2018-08-22 ENCOUNTER — Ambulatory Visit (INDEPENDENT_AMBULATORY_CARE_PROVIDER_SITE_OTHER): Payer: BLUE CROSS/BLUE SHIELD

## 2018-08-22 DIAGNOSIS — Z780 Asymptomatic menopausal state: Secondary | ICD-10-CM

## 2018-08-22 DIAGNOSIS — Z13820 Encounter for screening for osteoporosis: Secondary | ICD-10-CM
